# Patient Record
Sex: Male | Born: 1960 | Race: Black or African American | Hispanic: No | Smoking: Current every day smoker
Health system: Southern US, Community
[De-identification: ages and names within clinical notes are randomized; demographics above are authoritative.]

## PROBLEM LIST (undated history)

## (undated) DIAGNOSIS — M1990 Unspecified osteoarthritis, unspecified site: Secondary | ICD-10-CM

## (undated) DIAGNOSIS — M779 Enthesopathy, unspecified: Secondary | ICD-10-CM

## (undated) DIAGNOSIS — K219 Gastro-esophageal reflux disease without esophagitis: Secondary | ICD-10-CM

## (undated) DIAGNOSIS — J45909 Unspecified asthma, uncomplicated: Secondary | ICD-10-CM

## (undated) DIAGNOSIS — F419 Anxiety disorder, unspecified: Secondary | ICD-10-CM

## (undated) DIAGNOSIS — I10 Essential (primary) hypertension: Secondary | ICD-10-CM

## (undated) DIAGNOSIS — J449 Chronic obstructive pulmonary disease, unspecified: Secondary | ICD-10-CM

## (undated) HISTORY — DX: Enthesopathy, unspecified: M77.9

## (undated) HISTORY — PX: HAND SURGERY: SHX662

## (undated) HISTORY — DX: Anxiety disorder, unspecified: F41.9

---

## 2005-12-24 ENCOUNTER — Ambulatory Visit: Payer: Self-pay | Admitting: Internal Medicine

## 2006-01-09 ENCOUNTER — Ambulatory Visit: Payer: Self-pay | Admitting: *Deleted

## 2006-01-10 ENCOUNTER — Ambulatory Visit: Payer: Self-pay | Admitting: Internal Medicine

## 2006-10-29 ENCOUNTER — Encounter (INDEPENDENT_AMBULATORY_CARE_PROVIDER_SITE_OTHER): Payer: Self-pay | Admitting: *Deleted

## 2007-11-29 ENCOUNTER — Emergency Department (HOSPITAL_COMMUNITY): Admission: EM | Admit: 2007-11-29 | Discharge: 2007-11-29 | Payer: Self-pay | Admitting: Emergency Medicine

## 2007-12-27 ENCOUNTER — Emergency Department (HOSPITAL_COMMUNITY): Admission: EM | Admit: 2007-12-27 | Discharge: 2007-12-27 | Payer: Self-pay | Admitting: Emergency Medicine

## 2008-02-15 ENCOUNTER — Emergency Department (HOSPITAL_COMMUNITY): Admission: EM | Admit: 2008-02-15 | Discharge: 2008-02-15 | Payer: Self-pay | Admitting: Emergency Medicine

## 2008-07-20 ENCOUNTER — Encounter (INDEPENDENT_AMBULATORY_CARE_PROVIDER_SITE_OTHER): Payer: Self-pay | Admitting: Emergency Medicine

## 2008-07-20 ENCOUNTER — Ambulatory Visit: Payer: Self-pay | Admitting: Vascular Surgery

## 2008-07-20 ENCOUNTER — Emergency Department (HOSPITAL_COMMUNITY): Admission: EM | Admit: 2008-07-20 | Discharge: 2008-07-20 | Payer: Self-pay | Admitting: Emergency Medicine

## 2009-02-21 ENCOUNTER — Emergency Department (HOSPITAL_COMMUNITY): Admission: EM | Admit: 2009-02-21 | Discharge: 2009-02-21 | Payer: Self-pay | Admitting: Emergency Medicine

## 2010-05-21 LAB — DIFFERENTIAL
Basophils Relative: 0 % (ref 0–1)
Lymphs Abs: 1.3 10*3/uL (ref 0.7–4.0)
Monocytes Relative: 7 % (ref 3–12)

## 2010-05-21 LAB — BASIC METABOLIC PANEL
BUN: 9 mg/dL (ref 6–23)
CO2: 27 mEq/L (ref 19–32)
Chloride: 100 mEq/L (ref 96–112)
GFR calc Af Amer: >60 mL/min (ref >60)
GFR calc non Af Amer: >60 mL/min (ref >60)
Sodium: 137 mEq/L (ref 135–145)

## 2010-05-21 LAB — CBC
HCT: 40.8 % (ref 39.0–52.0)
Hemoglobin: 14.3 g/dL (ref 13.0–17.0)
Platelets: 266 10*3/uL (ref 150–400)
RBC: 4.14 MIL/uL — ABNORMAL LOW (ref 4.22–5.81)
RDW: 14.1 % (ref 11.5–15.5)
WBC: 6.1 10*3/uL (ref 4.0–10.5)

## 2010-05-21 LAB — D-DIMER, QUANTITATIVE: D-Dimer, Quant: 1.36 ug/mL-FEU — ABNORMAL HIGH (ref 0.00–0.48)

## 2010-05-21 LAB — APTT: aPTT: 34 seconds (ref 24–37)

## 2010-11-13 LAB — COMPREHENSIVE METABOLIC PANEL
AST: 97 — ABNORMAL HIGH
Albumin: 4
CO2: 25
Chloride: 97
Creatinine, Ser: 1.16
Glucose, Bld: 93
Potassium: 3.6
Total Bilirubin: 1

## 2010-11-13 LAB — URINALYSIS, ROUTINE W REFLEX MICROSCOPIC
Hgb urine dipstick: NEGATIVE
Ketones, ur: 40 — AB
Nitrite: NEGATIVE
Protein, ur: 30 — AB
Specific Gravity, Urine: >1.03 — ABNORMAL HIGH

## 2010-11-13 LAB — DIFFERENTIAL
Basophils Absolute: 0
Eosinophils Absolute: 0
Eosinophils Relative: 0

## 2010-11-13 LAB — CBC
MCHC: 35
Platelets: 248
RBC: 4.68
RDW: 14.9
WBC: 8.4

## 2010-11-13 LAB — ETHANOL: Alcohol, Ethyl (B): 10

## 2010-11-13 LAB — URINE MICROSCOPIC-ADD ON

## 2014-11-16 ENCOUNTER — Emergency Department (HOSPITAL_COMMUNITY): Payer: Self-pay

## 2014-11-16 ENCOUNTER — Encounter (HOSPITAL_COMMUNITY): Payer: Self-pay

## 2014-11-16 ENCOUNTER — Inpatient Hospital Stay (HOSPITAL_COMMUNITY)
Admission: EM | Admit: 2014-11-16 | Discharge: 2014-11-20 | DRG: 156 | Disposition: A | Discharge status: Home / Self Care | Payer: Self-pay | Attending: Surgery | Admitting: Surgery

## 2014-11-16 DIAGNOSIS — Z978 Presence of other specified devices: Secondary | ICD-10-CM

## 2014-11-16 DIAGNOSIS — J384 Edema of larynx: Secondary | ICD-10-CM | POA: Diagnosis present

## 2014-11-16 DIAGNOSIS — J988 Other specified respiratory disorders: Secondary | ICD-10-CM

## 2014-11-16 DIAGNOSIS — R402414 Glasgow coma scale score 13-15, 24 hours or more after hospital admission: Secondary | ICD-10-CM | POA: Diagnosis present

## 2014-11-16 DIAGNOSIS — Z01818 Encounter for other preprocedural examination: Secondary | ICD-10-CM

## 2014-11-16 DIAGNOSIS — F17210 Nicotine dependence, cigarettes, uncomplicated: Secondary | ICD-10-CM | POA: Diagnosis present

## 2014-11-16 DIAGNOSIS — J9690 Respiratory failure, unspecified, unspecified whether with hypoxia or hypercapnia: Secondary | ICD-10-CM

## 2014-11-16 DIAGNOSIS — S128XXA Fracture of other parts of neck, initial encounter: Principal | ICD-10-CM

## 2014-11-16 DIAGNOSIS — I10 Essential (primary) hypertension: Secondary | ICD-10-CM | POA: Diagnosis present

## 2014-11-16 DIAGNOSIS — M542 Cervicalgia: Secondary | ICD-10-CM

## 2014-11-16 HISTORY — DX: Essential (primary) hypertension: I10

## 2014-11-16 LAB — BASIC METABOLIC PANEL
Anion gap: 7 (ref 5–15)
BUN: 6 mg/dL (ref 6–20)
CHLORIDE: 107 mmol/L (ref 101–111)
CO2: 24 mmol/L (ref 22–32)
CREATININE: 0.84 mg/dL (ref 0.61–1.24)
Calcium: 7.9 mg/dL — ABNORMAL LOW (ref 8.9–10.3)
GFR calc non Af Amer: >60 mL/min (ref >60)
Glucose, Bld: 113 mg/dL — ABNORMAL HIGH (ref 65–99)
POTASSIUM: 4.1 mmol/L (ref 3.5–5.1)
SODIUM: 138 mmol/L (ref 135–145)

## 2014-11-16 LAB — PROTIME-INR
INR: 1.05 (ref 0.00–1.49)
PROTHROMBIN TIME: 13.9 s (ref 11.6–15.2)

## 2014-11-16 LAB — CBC WITH DIFFERENTIAL/PLATELET
BASOS ABS: 0 10*3/uL (ref 0.0–0.1)
BASOS PCT: 0 %
EOS ABS: 0 10*3/uL (ref 0.0–0.7)
Eosinophils Relative: 0 %
HCT: 37.5 % — ABNORMAL LOW (ref 39.0–52.0)
HEMOGLOBIN: 12.6 g/dL — AB (ref 13.0–17.0)
Lymphocytes Relative: 7 %
Lymphs Abs: 0.6 10*3/uL — ABNORMAL LOW (ref 0.7–4.0)
MCH: 32.5 pg (ref 26.0–34.0)
MCHC: 33.6 g/dL (ref 30.0–36.0)
MCV: 96.6 fL (ref 78.0–100.0)
Monocytes Absolute: 0.2 10*3/uL (ref 0.1–1.0)
Monocytes Relative: 2 %
NEUTROS PCT: 91 %
Neutro Abs: 7.9 10*3/uL — ABNORMAL HIGH (ref 1.7–7.7)
Platelets: 312 10*3/uL (ref 150–400)
RBC: 3.88 MIL/uL — AB (ref 4.22–5.81)
RDW: 13.7 % (ref 11.5–15.5)
WBC: 8.7 10*3/uL (ref 4.0–10.5)

## 2014-11-16 LAB — BLOOD GAS, ARTERIAL
Acid-base deficit: 5.5 mmol/L — ABNORMAL HIGH (ref 0.0–2.0)
Bicarbonate: 20.3 mEq/L (ref 20.0–24.0)
DRAWN BY: 308601
FIO2: 1
MECHVT: 530 mL
O2 SAT: 99.7 %
PATIENT TEMPERATURE: 98.6
PCO2 ART: 43.1 mmHg (ref 35.0–45.0)
PEEP: 5 cmH2O
PH ART: 7.294 — AB (ref 7.350–7.450)
RATE: 14 resp/min
TCO2: 18.9 mmol/L (ref 0–100)
pO2, Arterial: 395 mmHg — ABNORMAL HIGH (ref 80.0–100.0)

## 2014-11-16 LAB — TRIGLYCERIDES: Triglycerides: 143 mg/dL (ref <150)

## 2014-11-16 MED ORDER — FENTANYL CITRATE (PF) 100 MCG/2ML IJ SOLN
50.0000 ug | Freq: Once | INTRAMUSCULAR | Status: AC
  Administered 2014-11-16: 50 ug via INTRAVENOUS
  Filled 2014-11-16: qty 2

## 2014-11-16 MED ORDER — PANTOPRAZOLE SODIUM 40 MG IV SOLR
40.0000 mg | Freq: Every day | INTRAVENOUS | Status: DC
  Administered 2014-11-17 – 2014-11-18 (×2): 40 mg via INTRAVENOUS
  Filled 2014-11-16 (×2): qty 40

## 2014-11-16 MED ORDER — FENTANYL CITRATE (PF) 100 MCG/2ML IJ SOLN
100.0000 ug | INTRAMUSCULAR | Status: DC | PRN
Start: 2014-11-16 — End: 2014-11-19
  Administered 2014-11-17: 100 ug via INTRAVENOUS
  Filled 2014-11-16: qty 2

## 2014-11-16 MED ORDER — SODIUM CHLORIDE 0.9 % IV BOLUS (SEPSIS)
1000.0000 mL | Freq: Once | INTRAVENOUS | Status: AC
Start: 2014-11-16 — End: 2014-11-16
  Administered 2014-11-16: 1000 mL via INTRAVENOUS

## 2014-11-16 MED ORDER — DEXAMETHASONE SODIUM PHOSPHATE 10 MG/ML IJ SOLN: 10.0000 mg | Freq: Once | INTRAMUSCULAR | Status: DC

## 2014-11-16 MED ORDER — MIDAZOLAM HCL 2 MG/2ML IJ SOLN: 2.0000 mg | INTRAMUSCULAR | Status: DC | PRN

## 2014-11-16 MED ORDER — SODIUM CHLORIDE 0.9 % IV BOLUS (SEPSIS)
1000.0000 mL | Freq: Once | INTRAVENOUS | Status: AC
  Administered 2014-11-16: 1000 mL via INTRAVENOUS

## 2014-11-16 MED ORDER — FENTANYL CITRATE (PF) 100 MCG/2ML IJ SOLN
100.0000 ug | Freq: Once | INTRAMUSCULAR | Status: AC
  Administered 2014-11-16: 100 ug via INTRAVENOUS

## 2014-11-16 MED ORDER — ONDANSETRON HCL 4 MG/2ML IJ SOLN: 4.0000 mg | Freq: Four times a day (QID) | INTRAMUSCULAR | Status: DC | PRN

## 2014-11-16 MED ORDER — ONDANSETRON HCL 4 MG PO TABS: 4.0000 mg | ORAL_TABLET | Freq: Four times a day (QID) | ORAL | Status: DC | PRN

## 2014-11-16 MED ORDER — MORPHINE SULFATE (PF) 2 MG/ML IV SOLN: 1.0000 mg | INTRAVENOUS | Status: DC | PRN

## 2014-11-16 MED ORDER — PANTOPRAZOLE SODIUM 40 MG PO TBEC
40.0000 mg | DELAYED_RELEASE_TABLET | Freq: Every day | ORAL | Status: DC
  Administered 2014-11-19 – 2014-11-20 (×2): 40 mg via ORAL
  Filled 2014-11-16 (×2): qty 1

## 2014-11-16 MED ORDER — SUCCINYLCHOLINE CHLORIDE 20 MG/ML IJ SOLN
INTRAMUSCULAR | Status: AC
  Administered 2014-11-16: 20 mg
  Filled 2014-11-16: qty 1

## 2014-11-16 MED ORDER — PROPOFOL 1000 MG/100ML IV EMUL
INTRAVENOUS | Status: AC
  Filled 2014-11-16: qty 100

## 2014-11-16 MED ORDER — FENTANYL CITRATE (PF) 100 MCG/2ML IJ SOLN
100.0000 ug | INTRAMUSCULAR | Status: AC | PRN
  Administered 2014-11-16 (×3): 100 ug via INTRAVENOUS
  Filled 2014-11-16 (×3): qty 2

## 2014-11-16 MED ORDER — IOHEXOL 350 MG/ML SOLN
100.0000 mL | Freq: Once | INTRAVENOUS | Status: AC | PRN
  Administered 2014-11-16: 100 mL via INTRAVENOUS

## 2014-11-16 MED ORDER — PROPOFOL 1000 MG/100ML IV EMUL
0.0000 ug/min | INTRAVENOUS | Status: DC
  Administered 2014-11-16: 3333.333 ug/min via INTRAVENOUS
  Filled 2014-11-16 (×2): qty 100

## 2014-11-16 MED ORDER — LIDOCAINE-EPINEPHRINE 1 %-1:100000 IJ SOLN
INTRAMUSCULAR | Status: AC
  Filled 2014-11-16: qty 1

## 2014-11-16 MED ORDER — ETOMIDATE 2 MG/ML IV SOLN
INTRAVENOUS | Status: AC
  Administered 2014-11-16: 20 mg
  Filled 2014-11-16: qty 20

## 2014-11-16 MED ORDER — SODIUM CHLORIDE 0.9 % IV SOLN
1.5000 g | Freq: Four times a day (QID) | INTRAVENOUS | Status: DC
  Administered 2014-11-16 – 2014-11-19 (×11): 1.5 g via INTRAVENOUS
  Filled 2014-11-16 (×15): qty 1.5

## 2014-11-16 MED ORDER — RACEPINEPHRINE HCL 2.25 % IN NEBU
0.5000 mL | INHALATION_SOLUTION | RESPIRATORY_TRACT | Status: DC | PRN
Start: 2014-11-16 — End: 2014-11-20
  Administered 2014-11-16: 0.5 mL via RESPIRATORY_TRACT
  Filled 2014-11-16 (×2): qty 0.5

## 2014-11-16 MED ORDER — LIDOCAINE HCL (CARDIAC) 20 MG/ML IV SOLN
INTRAVENOUS | Status: AC
  Filled 2014-11-16: qty 5

## 2014-11-16 MED ORDER — DEXAMETHASONE SODIUM PHOSPHATE 10 MG/ML IJ SOLN
10.0000 mg | Freq: Once | INTRAMUSCULAR | Status: AC
  Administered 2014-11-16: 10 mg via INTRAVENOUS
  Filled 2014-11-16: qty 1

## 2014-11-16 MED ORDER — FENTANYL CITRATE (PF) 100 MCG/2ML IJ SOLN
INTRAMUSCULAR | Status: AC
  Filled 2014-11-16: qty 2

## 2014-11-16 MED ORDER — ROCURONIUM BROMIDE 50 MG/5ML IV SOLN
INTRAVENOUS | Status: AC
  Filled 2014-11-16: qty 2

## 2014-11-16 MED ORDER — PROPOFOL 1000 MG/100ML IV EMUL
5.0000 ug/kg/min | INTRAVENOUS | Status: DC
  Administered 2014-11-16: 50 ug/kg/min via INTRAVENOUS

## 2014-11-16 MED ORDER — KCL IN DEXTROSE-NACL 20-5-0.9 MEQ/L-%-% IV SOLN
INTRAVENOUS | Status: DC
  Administered 2014-11-16 – 2014-11-18 (×5): via INTRAVENOUS
  Filled 2014-11-16 (×8): qty 1000
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
## 2014-11-16 NOTE — ED Notes (Signed)
Bed: RESA Expected date:  Expected time:  Means of arrival:  Comments: No monitor

## 2014-11-16 NOTE — Progress Notes (Signed)
EDCM spoke to patient's fiance' at bedside. Patient currently intubated. Patient's fiance' confirms patient does not have a pcp or insurance living in [place].  [facility] provide patient with contact infromation to [facility], informed patient of services there.  EDCM also provided patient with list of pcps who accept self pay patients, list of discount pharmacies and websites needymeds.org and GoodRX.com for medication assistance, phone number to inquire about the orange card, phone number to inquire about [employer], phone number to inquire about the [place], financial resources in the community such as local churches, [employer], urban ministries, and dental assistance for uninsured patients.  Patient's fiance' thankful for resources.  No further EDCM needs at this time.

## 2014-11-16 NOTE — ED Notes (Signed)
Unable to collect labs at this MD working with patient.
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
## 2014-11-16 NOTE — ED Notes (Signed)
RRT PRESENT 
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
## 2014-11-16 NOTE — ED Notes (Signed)
Pt also admits to drinking 2 40's a day.

## 2014-11-16 NOTE — ED Notes (Signed)
Intubation- [employer] present. Dillon Bjork RN recorder. RRT Lattie Haw. EDP [place] with successful intubation.

## 2014-11-17 LAB — CBC
HCT: 36.7 % — ABNORMAL LOW (ref 39.0–52.0)
HEMOGLOBIN: 12.4 g/dL — AB (ref 13.0–17.0)
MCH: 32.9 pg (ref 26.0–34.0)
MCHC: 33.8 g/dL (ref 30.0–36.0)
MCV: 97.3 fL (ref 78.0–100.0)
Platelets: 269 10*3/uL (ref 150–400)
RBC: 3.77 MIL/uL — AB (ref 4.22–5.81)
RDW: 13.9 % (ref 11.5–15.5)
WBC: 7.9 10*3/uL (ref 4.0–10.5)

## 2014-11-17 LAB — BLOOD GAS, ARTERIAL
ACID-BASE DEFICIT: 0.3 mmol/L (ref 0.0–2.0)
Bicarbonate: 25.1 mEq/L — ABNORMAL HIGH (ref 20.0–24.0)
DRAWN BY: 301361
FIO2: 0.3
MECHVT: 500 mL
O2 SAT: 95.9 %
PCO2 ART: 50.6 mmHg — AB (ref 35.0–45.0)
PEEP/CPAP: 5 cmH2O
PH ART: 7.316 — AB (ref 7.350–7.450)
PO2 ART: 90 mmHg (ref 80.0–100.0)
Patient temperature: 98.6
RATE: 14 resp/min
TCO2: 26.6 mmol/L (ref 0–100)

## 2014-11-17 LAB — MRSA PCR SCREENING: MRSA BY PCR: POSITIVE — AB

## 2014-11-17 LAB — BASIC METABOLIC PANEL
Anion gap: 9 (ref 5–15)
BUN: <5 mg/dL — ABNORMAL LOW (ref 6–20)
CALCIUM: 8 mg/dL — AB (ref 8.9–10.3)
CHLORIDE: 109 mmol/L (ref 101–111)
CO2: 22 mmol/L (ref 22–32)
CREATININE: 0.79 mg/dL (ref 0.61–1.24)
Glucose, Bld: 130 mg/dL — ABNORMAL HIGH (ref 65–99)
Potassium: 4 mmol/L (ref 3.5–5.1)
SODIUM: 140 mmol/L (ref 135–145)

## 2014-11-17 LAB — TRIGLYCERIDES: TRIGLYCERIDES: 55 mg/dL (ref <150)

## 2014-11-17 MED ORDER — ANTISEPTIC ORAL RINSE SOLUTION (CORINZ)
7.0000 mL | Freq: Four times a day (QID) | OROMUCOSAL | Status: DC
  Administered 2014-11-17 – 2014-11-18 (×7): 7 mL via OROMUCOSAL

## 2014-11-17 MED ORDER — CHLORHEXIDINE GLUCONATE 0.12% ORAL RINSE (MEDLINE KIT)
15.0000 mL | Freq: Two times a day (BID) | OROMUCOSAL | Status: DC
  Administered 2014-11-17 – 2014-11-18 (×3): 15 mL via OROMUCOSAL

## 2014-11-17 MED ORDER — PROPOFOL 1000 MG/100ML IV EMUL
5.0000 ug/kg/min | INTRAVENOUS | Status: DC
  Administered 2014-11-17 (×2): 40 ug/kg/min via INTRAVENOUS
  Administered 2014-11-17: 60 ug/kg/min via INTRAVENOUS
  Administered 2014-11-17: 55 ug/kg/min via INTRAVENOUS
  Administered 2014-11-18 (×2): 40 ug/kg/min via INTRAVENOUS
  Filled 2014-11-17 (×5): qty 100

## 2014-11-17 MED ORDER — DEXAMETHASONE SODIUM PHOSPHATE 4 MG/ML IJ SOLN
8.0000 mg | Freq: Three times a day (TID) | INTRAMUSCULAR | Status: AC
  Administered 2014-11-17 – 2014-11-18 (×4): 8 mg via INTRAVENOUS
  Filled 2014-11-17 (×4): qty 2

## 2014-11-17 MED ORDER — CHLORHEXIDINE GLUCONATE CLOTH 2 % EX PADS
6.0000 | MEDICATED_PAD | Freq: Every day | CUTANEOUS | Status: DC
  Administered 2014-11-17 – 2014-11-19 (×3): 6 via TOPICAL

## 2014-11-17 MED ORDER — SODIUM CHLORIDE 0.9 % IV SOLN
25.0000 ug/h | INTRAVENOUS | Status: DC
  Administered 2014-11-17: 200 ug/h via INTRAVENOUS
  Administered 2014-11-17: 100 ug/h via INTRAVENOUS
  Administered 2014-11-18: 200 ug/h via INTRAVENOUS
  Filled 2014-11-17 (×3): qty 50

## 2014-11-17 MED ORDER — MUPIROCIN 2 % EX OINT
1.0000 "application " | TOPICAL_OINTMENT | Freq: Two times a day (BID) | CUTANEOUS | Status: DC
  Administered 2014-11-17 – 2014-11-20 (×8): 1 via NASAL
  Filled 2014-11-17 (×3): qty 22
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
## 2014-11-18 ENCOUNTER — Inpatient Hospital Stay (HOSPITAL_COMMUNITY): Payer: Self-pay

## 2014-11-18 LAB — BLOOD GAS, ARTERIAL
Acid-Base Excess: 1.5 mmol/L (ref 0.0–2.0)
BICARBONATE: 26.4 meq/L — AB (ref 20.0–24.0)
Drawn by: 41308
FIO2: 0.3
LHR: 14 {breaths}/min
O2 Saturation: 94.9 %
PATIENT TEMPERATURE: 98.6
PCO2 ART: 48.6 mmHg — AB (ref 35.0–45.0)
PEEP: 5 cmH2O
TCO2: 27.9 mmol/L (ref 0–100)
VT: 500 mL
pH, Arterial: 7.354 (ref 7.350–7.450)
pO2, Arterial: 80.3 mmHg (ref 80.0–100.0)

## 2014-11-18 LAB — CBC
HEMATOCRIT: 40.6 % (ref 39.0–52.0)
HEMOGLOBIN: 13.8 g/dL (ref 13.0–17.0)
MCH: 33.1 pg (ref 26.0–34.0)
MCHC: 34 g/dL (ref 30.0–36.0)
MCV: 97.4 fL (ref 78.0–100.0)
Platelets: 346 10*3/uL (ref 150–400)
RBC: 4.17 MIL/uL — AB (ref 4.22–5.81)
RDW: 14.3 % (ref 11.5–15.5)
WBC: 10 10*3/uL (ref 4.0–10.5)

## 2014-11-18 LAB — BASIC METABOLIC PANEL
Anion gap: 11 (ref 5–15)
CHLORIDE: 108 mmol/L (ref 101–111)
CO2: 26 mmol/L (ref 22–32)
Calcium: 8.7 mg/dL — ABNORMAL LOW (ref 8.9–10.3)
Creatinine, Ser: 0.7 mg/dL (ref 0.61–1.24)
GFR calc Af Amer: >60 mL/min (ref >60)
GFR calc non Af Amer: >60 mL/min (ref >60)
Glucose, Bld: 143 mg/dL — ABNORMAL HIGH (ref 65–99)
POTASSIUM: 4.5 mmol/L (ref 3.5–5.1)
SODIUM: 145 mmol/L (ref 135–145)

## 2014-11-18 MED ORDER — ENOXAPARIN SODIUM 30 MG/0.3ML ~~LOC~~ SOLN
30.0000 mg | Freq: Two times a day (BID) | SUBCUTANEOUS | Status: DC
  Administered 2014-11-18 – 2014-11-19 (×4): 30 mg via SUBCUTANEOUS
  Filled 2014-11-18 (×5): qty 0.3

## 2014-11-18 MED ORDER — MORPHINE SULFATE (PF) 2 MG/ML IV SOLN: 2.0000 mg | INTRAVENOUS | Status: DC | PRN
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
## 2014-11-18 NOTE — Procedures (Signed)
Extubation Procedure Note  Patient Details:   Name: Daniel [name] DOB: [birth year] MRN: [id number]   Airway Documentation:     Evaluation  O2 sats: stable throughout Complications: No apparent complications Patient did tolerate procedure well. Bilateral Breath Sounds: Rhonchi Suctioning: Airway Yes   Patient was extubated to a 4L [place]. Cuff leak was heard. No stridor noted. MD, RN were at bedside during extubation. Patient was able to talk upon extubation. RT will continue to monitor.  Tiburcio Bash 11/18/2014, 11:37 AM

## 2014-11-18 NOTE — Progress Notes (Signed)
Fentanyl gtt wasted into sink, 180 mL, wasted with Cleotis Nipper RN.

## 2014-11-18 NOTE — Progress Notes (Signed)
Cervical Collar D/C'ed at 15:15 11/18/14 per Silvestre Gunner, PA and ENT MD decision.

## 2014-11-19 LAB — TRIGLYCERIDES: TRIGLYCERIDES: 60 mg/dL (ref <150)

## 2014-11-19 MED ORDER — OXYCODONE HCL 5 MG PO TABS: 10.0000 mg | ORAL_TABLET | ORAL | Status: DC | PRN

## 2014-11-19 MED ORDER — ACETAMINOPHEN 325 MG PO TABS: 650.0000 mg | ORAL_TABLET | Freq: Four times a day (QID) | ORAL | Status: DC | PRN
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
## 2014-11-20 MED ORDER — OXYCODONE HCL 10 MG PO TABS: 10.0000 mg | ORAL_TABLET | ORAL | Status: DC | PRN
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
## 2014-11-20 NOTE — Discharge Instructions (Signed)
Respiratory failure is when your lungs are not working well and your breathing (respiratory) system fails. When respiratory failure occurs, it is difficult for your lungs to get enough oxygen, get rid of carbon dioxide, or both. Respiratory failure can be life threatening.  °Respiratory failure can be acute or chronic. Acute respiratory failure is sudden, severe, and requires emergency medical treatment. Chronic respiratory failure is less severe, happens over time, and requires ongoing treatment.  °WHAT ARE THE CAUSES OF ACUTE RESPIRATORY FAILURE?  °Any problem affecting the heart or lungs can cause acute respiratory failure. Some of these causes include the following: °· Chronic bronchitis and emphysema (COPD).   °· Blood clot going to a lung (pulmonary embolism).   °· Having water in the lungs caused by heart failure, lung injury, or infection (pulmonary edema).   °· Collapsed lung (pneumothorax).   °· Pneumonia.   °· Pulmonary fibrosis.   °· Obesity.   °· Asthma.   °· Heart failure.   °· Any type of trauma to the chest that can make breathing difficult.   °· Nerve or muscle diseases making chest movements difficult. °HOW WILL MY ACUTE RESPIRATORY FAILURE BE TREATED?  °Treatment of acute respiratory failure depends on the cause of the respiratory failure. Usually, you will stay in the intensive care unit so your breathing can be watched closely. Treatment can include the following: °· Oxygen. Oxygen can be delivered through the following: °¨ Nasal cannula. This is small tubing that goes in your nose to give you oxygen. °¨ Face mask. A face mask covers your nose and mouth to give you oxygen. °· Medicine. Different medicines can be given to help with breathing. These can include: °¨ Nebulizers. Nebulizers deliver medicines to open the air passages (bronchodilators). These medicines help to open or relax the airways in the lungs so you can breathe better. They can also help loosen mucus from your  lungs. °¨ Diuretics. Diuretic medicines can help you breathe better by getting rid of extra water in your body. °¨ Steroids. Steroid medicines can help decrease swelling (inflammation) in your lungs. °¨ Antibiotics. °· Chest tube. If you have a collapsed lung (pneumothorax), a chest tube is placed to help reinflate the lung. °· Noninvasive positive pressure ventilation (NPPV). This is a tight-fitting mask that goes over your nose and mouth. The mask has tubing that is attached to a machine. The machine blows air into the tubing, which helps to keep the tiny air sacs (alveoli) in your lungs open. This machine allows you to breathe on your own. °· Ventilator. A ventilator is a breathing machine. When on a ventilator, a breathing tube is put into the lungs. A ventilator is used when you can no longer breathe well enough on your own. You may have low oxygen levels or high carbon dioxide (CO2) levels in your blood. When you are on a ventilator, sedation and pain medicines are given to make you sleep so your lungs can heal. °SEEK IMMEDIATE MEDICAL CARE IF: °· You have shortness of breath (dyspnea) with or without activity. °· You have rapid breathing (tachypnea). °· You are wheezing. °· You are unable to say more than a few words without having to catch your breath. °· You find it very difficult to function normally. °· You have a fast heart rate. °· You have a bluish color to your finger or toe nail beds. °· You have confusion or drowsiness or both. °  °This information is not intended to replace advice given to you by your health care provider. Make sure you discuss   any questions you have with your health care provider.   Document Released: 02/02/2013 Document Revised: 10/19/2014 Document Reviewed: 02/02/2013 Elsevier Interactive Patient Education 2016 [place].     Call ENT DR [facility] OFFICE Monday FOR FOLLOW UP APPT IN 2 WEEKS

## 2015-02-14 ENCOUNTER — Emergency Department (HOSPITAL_BASED_OUTPATIENT_CLINIC_OR_DEPARTMENT_OTHER): Payer: Self-pay

## 2015-02-14 ENCOUNTER — Encounter (HOSPITAL_BASED_OUTPATIENT_CLINIC_OR_DEPARTMENT_OTHER): Payer: Self-pay | Admitting: *Deleted

## 2015-02-14 DIAGNOSIS — M1990 Unspecified osteoarthritis, unspecified site: Secondary | ICD-10-CM | POA: Insufficient documentation

## 2015-02-14 DIAGNOSIS — I10 Essential (primary) hypertension: Secondary | ICD-10-CM | POA: Insufficient documentation

## 2015-02-14 DIAGNOSIS — F17210 Nicotine dependence, cigarettes, uncomplicated: Secondary | ICD-10-CM | POA: Insufficient documentation

## 2015-02-14 DIAGNOSIS — K4091 Unilateral inguinal hernia, without obstruction or gangrene, recurrent: Secondary | ICD-10-CM | POA: Insufficient documentation

## 2015-02-14 DIAGNOSIS — Z79899 Other long term (current) drug therapy: Secondary | ICD-10-CM | POA: Insufficient documentation

## 2015-02-14 DIAGNOSIS — J209 Acute bronchitis, unspecified: Secondary | ICD-10-CM | POA: Insufficient documentation

## 2015-02-14 MED ORDER — IPRATROPIUM-ALBUTEROL 0.5-2.5 (3) MG/3ML IN SOLN
3.0000 mL | Freq: Once | RESPIRATORY_TRACT | Status: AC
  Administered 2015-02-14: 3 mL via RESPIRATORY_TRACT
  Filled 2015-02-14: qty 3

## 2015-02-14 MED ORDER — ALBUTEROL SULFATE (2.5 MG/3ML) 0.083% IN NEBU
2.5000 mg | INHALATION_SOLUTION | Freq: Once | RESPIRATORY_TRACT | Status: AC
  Administered 2015-02-14: 2.5 mg via RESPIRATORY_TRACT
  Filled 2015-02-14: qty 3

## 2015-02-14 NOTE — Progress Notes (Signed)
Patient states that his breathing has improved after nebulizer treatment.

## 2015-02-14 NOTE — ED Notes (Signed)
Runny nose, cough x 2 years. Recent pain in his right testicle when he coughs.

## 2015-02-15 ENCOUNTER — Emergency Department (HOSPITAL_BASED_OUTPATIENT_CLINIC_OR_DEPARTMENT_OTHER)
Admission: EM | Admit: 2015-02-15 | Discharge: 2015-02-15 | Disposition: A | Discharge status: Home / Self Care | Payer: Self-pay | Attending: Emergency Medicine | Admitting: Emergency Medicine

## 2015-02-15 DIAGNOSIS — K4091 Unilateral inguinal hernia, without obstruction or gangrene, recurrent: Secondary | ICD-10-CM

## 2015-02-15 DIAGNOSIS — J40 Bronchitis, not specified as acute or chronic: Secondary | ICD-10-CM

## 2015-02-15 HISTORY — DX: Unspecified osteoarthritis, unspecified site: M19.90

## 2015-02-15 MED ORDER — ALBUTEROL SULFATE HFA 108 (90 BASE) MCG/ACT IN AERS: 2.0000 | INHALATION_SPRAY | Freq: Four times a day (QID) | RESPIRATORY_TRACT | Status: DC | PRN

## 2015-02-15 MED ORDER — ALBUTEROL SULFATE HFA 108 (90 BASE) MCG/ACT IN AERS
2.0000 | INHALATION_SPRAY | RESPIRATORY_TRACT | Status: DC | PRN
  Administered 2015-02-15: 2 via RESPIRATORY_TRACT
  Filled 2015-02-15: qty 6.7

## 2015-02-15 MED ORDER — DOXYCYCLINE HYCLATE 100 MG PO CAPS: 100.0000 mg | ORAL_CAPSULE | Freq: Two times a day (BID) | ORAL | Status: DC

## 2015-02-15 MED ORDER — DEXAMETHASONE SODIUM PHOSPHATE 10 MG/ML IJ SOLN
10.0000 mg | Freq: Once | INTRAMUSCULAR | Status: AC
  Administered 2015-02-15: 10 mg via INTRAMUSCULAR
  Filled 2015-02-15: qty 1

## 2015-02-15 NOTE — ED Provider Notes (Signed)
CSN: [id number]     Arrival date & time 02/14/15  2057 History   By signing my name below, I, [name], attest that this documentation has been prepared under the direction and in the presence of Merryl Hacker, MD.  Electronically Signed: [name], ED Scribe. 02/15/2015. 1:36 AM.   Chief Complaint  Patient presents with  . URI  . Groin Pain   The history is provided by the patient. No language interpreter was used.    HPI Comments: Daniel [name] is a 55 y.o. male with a PMHx of HTN who presents to the Emergency Department here for a cough. Pt c/o constant, ongoing productive cough, rhinorrhea.  Patient reports 2 year history of cough. He is a chain smoker. Denies productivity of cough. Denies fevers.Pt states he feels as though his R testicle "draws up" whenever he coughs.  Denies testicular masses.   No OTC medications or home remedies attempted prior to arrival. He denies any fever, chills, nausea, or vomiting. Pt reports normal bowel movements. He is an every day heavy smoker.   Patient reports that his wife finally convinced him to come. He does not have insurance and that is why he has not been evaluated for his chronic cough.  PCP: No primary care provider on file.    Past Medical History  Diagnosis Date  . Hypertension   . Arthritis    Past Surgical History  Procedure Laterality Date  . Hand surgery Right    No family history on file. Social History  Substance Use Topics  . Smoking status: Current Every Day Smoker -- 1.50 packs/day    Types: Cigarettes  . Smokeless tobacco: None  . Alcohol Use: Yes     Comment: daily    Review of Systems  Constitutional: Positive for chills. Negative for fever.  HENT: Positive for rhinorrhea.   Respiratory: Positive for cough. Negative for chest tightness and shortness of breath.   Cardiovascular: Negative for chest pain.  Gastrointestinal: Negative for vomiting, abdominal pain, diarrhea and constipation.  Genitourinary:  Positive for testicular pain.  Psychiatric/Behavioral: Negative for confusion.      Allergies  Review of patient's allergies indicates no known allergies.  Home Medications   Prior to Admission medications   Medication Sig Start Date End Date Taking? Authorizing Provider  albuterol (PROVENTIL HFA;VENTOLIN HFA) 108 (90 Base) MCG/ACT inhaler Inhale 2 puffs into the lungs every 6 (six) hours as needed for wheezing or shortness of breath. 02/15/15   Merryl Hacker, MD  aspirin-acetaminophen-caffeine (EXCEDRIN MIGRAINE) [phone] MG tablet Take 1 tablet by mouth every 6 (six) hours as needed for headache.    Historical Provider, MD  doxycycline (VIBRAMYCIN) 100 MG capsule Take 1 capsule (100 mg total) by mouth 2 (two) times daily. 02/15/15   Merryl Hacker, MD  oxyCODONE 10 MG TABS Take 1 tablet (10 mg total) by mouth every 4 (four) hours as needed for moderate pain. 11/20/14   Erroll Luna, MD   Triage Vitals: BP 127/89 mmHg  Pulse 92  Temp(Src) 98.2 F (36.8 C) (Oral)  Resp 20  Ht 5\' 7"  (1.702 m)  Wt 143 lb (64.864 kg)  BMI 22.39 kg/m2  SpO2 98%   Physical Exam  Constitutional: He is oriented to person, place, and time. He appears well-developed and well-nourished. No distress.  HENT:  Head: Normocephalic and atraumatic.  Cardiovascular: Normal rate, regular rhythm and normal heart sounds.   No murmur heard. Pulmonary/Chest: Effort normal. No respiratory distress. He  has wheezes. He has no rales.  Abdominal: Soft. Bowel sounds are normal. There is no tenderness. There is no rebound.  Genitourinary: Penis normal.   No scrotal masses palpated, reducible inguinal hernia palpated within the inguinal region , no overlying skin changes  Musculoskeletal: He exhibits no edema.  Neurological: He is alert and oriented to person, place, and time.  Skin: Skin is warm and dry.  Psychiatric: He has a normal mood and affect.  Nursing note and vitals reviewed.   ED Course  Procedures  (including critical care time)  DIAGNOSTIC STUDIES: Oxygen Saturation is 96% on RA, adequate by my interpretation.    COORDINATION OF CARE: 1:30 AM- Will order CXR. Will give breathing treatment. Discussed treatment plan with pt at bedside and pt agreed to plan.     Labs Review Labs Reviewed - No data to display  Imaging Review Dg Chest 2 View  02/14/2015  CLINICAL DATA:  Cough and SOB x 2 years with sudden onset of pulling sensation in groin area. HX: HTN,smoker EXAM: CHEST  2 VIEW COMPARISON:  11/18/2014 FINDINGS: The lungs are hyperinflated. Heart size is normal. Lungs are clear. No pulmonary edema. IMPRESSION: No evidence for acute cardiopulmonary abnormality.  Hyperinflation. Electronically Signed   By: Nolon Nations M.D.   On: 02/14/2015 21:42   I have personally reviewed and evaluated these images and lab results as part of my medical decision-making.   EKG Interpretation None      MDM   Final diagnoses:  Bronchitis  Unilateral recurrent inguinal hernia without obstruction or gangrene     patient presents with multiple complaints including rhinorrhea, cough and right testicular pain with coughing. Symptoms have been ongoing and progressive. He is a smoker. Suspect underlying COPD. He is wheezing on exam. He is otherwise nontoxic and satting 98% on room air. Patient was given an inhaler and a dose of Decadron. Chest x-ray is negative for pneumonia.  He does have a small palpable inguinal hernia that is reducible on exam. No evidence of strength relation or incarceration. Reports normal bowel movements without vomiting. Suspect that this is causing his pain with coughing and increased  Intra-abdominal pressure. Discussed this with the patient.   Will treat patient for chronic bronchitis/COPD. He  Will be given  An inhaler and doxycycline at discharge. He was given a referral for [facility]. He was also given surgical referral for his inguinal hernia.  After history, exam, and  medical workup I feel the patient has been appropriately medically screened and is safe for discharge home. Pertinent diagnoses were discussed with the patient. Patient was given return precautions.  I personally performed the services described in this documentation, which was scribed in my presence. The recorded information has been reviewed and is accurate.   Merryl Hacker, MD 02/15/15 [phone]

## 2015-02-15 NOTE — Discharge Instructions (Signed)
Inguinal Hernia, Adult Muscles help keep everything in the body in its proper place. But if a weak spot in the muscles develops, something can poke through. That is called a hernia. When this happens in the lower part of the belly (abdomen), it is called an inguinal hernia. (It takes its name from a part of the body in this region called the inguinal canal.) A weak spot in the wall of muscles lets some fat or part of the small intestine bulge through. An inguinal hernia can develop at any age. Men get them more often than women. CAUSES  In adults, an inguinal hernia develops over time.  It can be triggered by:  Suddenly straining the muscles of the lower abdomen.  Lifting heavy objects.  Straining to have a bowel movement. Difficult bowel movements (constipation) can lead to this.  Constant coughing. This may be caused by smoking or lung disease.  Being overweight.  Being pregnant.  Working at a job that requires long periods of standing or heavy lifting.  Having had an inguinal hernia before. One type can be an emergency situation. It is called a strangulated inguinal hernia. It develops if part of the small intestine slips through the weak spot and cannot get back into the abdomen. The blood supply can be cut off. If that happens, part of the intestine may die. This situation requires emergency surgery. SYMPTOMS  Often, a small inguinal hernia has no symptoms. It is found when a healthcare provider does a physical exam. Larger hernias usually have symptoms.   In adults, symptoms may include:  A lump in the groin. This is easier to see when the person is standing. It might disappear when lying down.  In men, a lump in the scrotum.  Pain or burning in the groin. This occurs especially when lifting, straining or coughing.  A dull ache or feeling of pressure in the groin.  Signs of a strangulated hernia can include:  A bulge in the groin that becomes very painful and tender to the  touch.  A bulge that turns red or purple.  Fever, nausea and vomiting.  Inability to have a bowel movement or to pass gas. DIAGNOSIS  To decide if you have an inguinal hernia, a healthcare provider will probably do a physical examination.  This will include asking questions about any symptoms you have noticed.  The healthcare provider might feel the groin area and ask you to cough. If an inguinal hernia is felt, the healthcare provider may try to slide it back into the abdomen.  Usually no other tests are needed. TREATMENT  Treatments can vary. The size of the hernia makes a difference. Options include:  Watchful waiting. This is often suggested if the hernia is small and you have had no symptoms.  No medical procedure will be done unless symptoms develop.  You will need to watch closely for symptoms. If any occur, contact your healthcare provider right away.  Surgery. This is used if the hernia is larger or you have symptoms.  Open surgery. This is usually an outpatient procedure (you will not stay overnight in a hospital). An cut (incision) is made through the skin in the groin. The hernia is put back inside the abdomen. The weak area in the muscles is then repaired by herniorrhaphy or hernioplasty. Herniorrhaphy: in this type of surgery, the weak muscles are sewn back together. Hernioplasty: a patch or mesh is used to close the weak area in the abdominal wall.  Laparoscopy.  In this procedure, a surgeon makes small incisions. A thin tube with a tiny video camera (called a laparoscope) is put into the abdomen. The surgeon repairs the hernia with mesh by looking with the video camera and using two long instruments. HOME CARE INSTRUCTIONS   After surgery to repair an inguinal hernia:  You will need to take pain medicine prescribed by your healthcare provider. Follow all directions carefully.  You will need to take care of the wound from the incision.  Your activity will be  restricted for awhile. This will probably include no heavy lifting for several weeks. You also should not do anything too active for a few weeks. When you can return to work will depend on the type of job that you have.  During "watchful waiting" periods, you should:  Maintain a healthy weight.  Eat a diet high in fiber (fruits, vegetables and whole grains).  Drink plenty of fluids to avoid constipation. This means drinking enough water and other liquids to keep your urine clear or pale yellow.  Do not lift heavy objects.  Do not stand for long periods of time.  Quit smoking. This should keep you from developing a frequent cough. SEEK MEDICAL CARE IF:   A bulge develops in your groin area.  You feel pain, a burning sensation or pressure in the groin. This might be worse if you are lifting or straining.  You develop a fever of more than 100.5 F (38.1 C). SEEK IMMEDIATE MEDICAL CARE IF:   Pain in the groin increases suddenly.  A bulge in the groin gets bigger suddenly and does not go down.  For men, there is sudden pain in the scrotum. Or, the size of the scrotum increases.  A bulge in the groin area becomes red or purple and is painful to touch.  You have nausea or vomiting that does not go away.  You feel your heart beating much faster than normal.  You cannot have a bowel movement or pass gas.  You develop a fever of more than 102.0 F (38.9 C).   This information is not intended to replace advice given to you by your health care provider. Make sure you discuss any questions you have with your health care provider.   Document Released: 06/16/2008 Document Revised: 04/22/2011 Document Reviewed: 08/01/2014 Elsevier Interactive Patient Education 2016 Else[employer] Chronic Bronchitis Chronic bronchitis is a lasting inflammation of the bronchial tubes, which are the tubes that carry air into your lungs. This is inflammation that occurs:   On most days of the week.    For at least three months at a time.   Over a period of two years in a row. When the bronchial tubes are inflamed, they start to produce mucus. The inflammation and buildup of mucus make it more difficult to breathe. Chronic bronchitis is usually a permanent problem and is one type of chronic obstructive pulmonary disease (COPD). People with chronic bronchitis are at greater risk for getting repeated colds, or respiratory infections. CAUSES  Chronic bronchitis most often occurs in people who have:  Long-standing, severe asthma.  A history of smoking.  Asthma and who also smoke. SIGNS AND SYMPTOMS  Chronic bronchitis may cause the following:   A cough that brings up mucus (productive cough).  Shortness of breath.  Early morning headache.  Wheezing.  Chest discomfort.   Recurring respiratory infections. DIAGNOSIS  Your health care provider may confirm the diagnosis by:  Taking your medical history.  Performing a  physical exam.  Taking a chest X-ray.   Performing pulmonary function tests. TREATMENT  Treatment involves controlling symptoms with medicines, oxygen therapy, or making lifestyle changes, such as exercising and eating a healthy, well-balanced diet. Medicines could include:  Inhalers to improve air flow in and out of your lungs.  Antibiotics to treat bacterial infections, such as pneumonia, sinus infections, and acute bronchitis. As a preventative measure, your health care provider may recommend routine vaccinations for influenza and pneumonia. This is to prevent infection and hospitalization since you may be more at risk for these types of infections.  HOME CARE INSTRUCTIONS  Take medicines only as directed by your health care provider.   If you smoke cigarettes, chew tobacco, or use electronic cigarettes, quit. If you need help quitting, ask your health care provider.  Avoid pollen, dust, animal dander, molds, smoke, and other things that cause  shortness of breath or wheezing attacks.  Talk to your health care provider about possible exercise routines. Regular exercise is very important to help you feel better.  If you are prescribed oxygen use at home follow these guidelines:  Never smoke while using oxygen. Oxygen does not burn or explode, but flammable materials will burn faster in the presence of oxygen.  Keep a Data processing manager close by. Let your fire department know that you have oxygen in your home.  Warn visitors not to smoke near you when you are using oxygen. Put up "no smoking" signs in your home where you most often use the oxygen.  Regularly test your smoke detectors at home to make sure they work. If you receive care in your home from a nurse or other health care provider, he or she may also check to make sure your smoke detectors work.  Ask your health care provider whether you would benefit from a pulmonary rehabilitation program.  Do not wait to get medical care if you have any concerning symptoms. Delays could cause permanent injury and may be life threatening. SEEK MEDICAL CARE IF:  You have increased coughing or shortness of breath or both.  You have muscle aches.  You have chest pain.  Your mucus gets thicker.  Your mucus changes from clear or white to yellow, green, gray, or bloody. SEEK IMMEDIATE MEDICAL CARE IF:  Your usual medicines do not stop your wheezing.   You have increased difficulty breathing.   You have any problems with the medicine you are taking, such as a rash, itching, swelling, or trouble breathing. MAKE SURE YOU:   Understand these instructions.  Will watch your condition.  Will get help right away if you are not doing well or get worse.   This information is not intended to replace advice given to you by your health care provider. Make sure you discuss any questions you have with your health care provider.   Document Released: 11/15/2005 Document Revised: 02/18/2014  Document Reviewed: 03/08/2013 Elsevier Interactive Patient Education [employer].

## 2015-02-15 NOTE — ED Notes (Signed)
Patient stable and ambulatory.  Patient verbalizes understanding of discharge medications, instructions and follow-up. 

## 2016-04-13 ENCOUNTER — Encounter (HOSPITAL_COMMUNITY): Payer: Self-pay | Admitting: Emergency Medicine

## 2016-04-13 ENCOUNTER — Emergency Department (HOSPITAL_COMMUNITY)
Admission: EM | Admit: 2016-04-13 | Discharge: 2016-04-13 | Disposition: A | Discharge status: Home / Self Care | Payer: Self-pay | Attending: Emergency Medicine | Admitting: Emergency Medicine

## 2016-04-13 DIAGNOSIS — Z79899 Other long term (current) drug therapy: Secondary | ICD-10-CM | POA: Insufficient documentation

## 2016-04-13 DIAGNOSIS — K4090 Unilateral inguinal hernia, without obstruction or gangrene, not specified as recurrent: Secondary | ICD-10-CM | POA: Insufficient documentation

## 2016-04-13 DIAGNOSIS — Z7982 Long term (current) use of aspirin: Secondary | ICD-10-CM | POA: Insufficient documentation

## 2016-04-13 DIAGNOSIS — F17210 Nicotine dependence, cigarettes, uncomplicated: Secondary | ICD-10-CM | POA: Insufficient documentation

## 2016-04-13 DIAGNOSIS — I10 Essential (primary) hypertension: Secondary | ICD-10-CM | POA: Insufficient documentation

## 2016-04-13 MED ORDER — IBUPROFEN 200 MG PO TABS
400.0000 mg | ORAL_TABLET | Freq: Once | ORAL | Status: AC
  Administered 2016-04-13: 400 mg via ORAL
  Filled 2016-04-13: qty 2

## 2016-04-13 MED ORDER — ACETAMINOPHEN 325 MG PO TABS
650.0000 mg | ORAL_TABLET | Freq: Once | ORAL | Status: AC
  Administered 2016-04-13: 650 mg via ORAL
  Filled 2016-04-13: qty 2

## 2016-04-13 NOTE — ED Provider Notes (Signed)
[facility] DEPT Provider Note   CSN: [id number] Arrival date & time: 04/13/16  1735     History   Chief Complaint Chief Complaint  Patient presents with  . Inguinal Hernia    HPI Daniel [name] is a 56 y.o. male.  Patient c/o right sided hernia for the past 1 year. States it is bothering him more and feels it is getting bigger. With standing upright, or straining he says it pooches out, and then will go back in.  No incarceration. No scrotal or testicular pain. Mild pain to right groin intermittently. Denies abd distension. No nausea or vomiting. Is having normal bms.  Denies prior eval for same.    The history is provided by the patient.    Past Medical History:  Diagnosis Date  . Arthritis   . Hypertension     Patient Active Problem List   Diagnosis Date Noted  . Fracture, thyroid cartilage closed ([place]) 11/16/2014    Past Surgical History:  Procedure Laterality Date  . HAND SURGERY Right        Home Medications    Prior to Admission medications   Medication Sig Start Date End Date Taking? Authorizing Provider  albuterol (PROVENTIL HFA;VENTOLIN HFA) 108 (90 Base) MCG/ACT inhaler Inhale 2 puffs into the lungs every 6 (six) hours as needed for wheezing or shortness of breath. 02/15/15   Merryl Hacker, MD  aspirin-acetaminophen-caffeine (EXCEDRIN MIGRAINE) [phone] MG tablet Take 1 tablet by mouth every 6 (six) hours as needed for headache.    Historical Provider, MD  doxycycline (VIBRAMYCIN) 100 MG capsule Take 1 capsule (100 mg total) by mouth 2 (two) times daily. 02/15/15   Merryl Hacker, MD  oxyCODONE 10 MG TABS Take 1 tablet (10 mg total) by mouth every 4 (four) hours as needed for moderate pain. 11/20/14   Erroll Luna, MD    Family History No family history on file.  Social History Social History  Substance Use Topics  . Smoking status: Current Every Day Smoker    Packs/day: 1.50    Types: Cigarettes  . Smokeless tobacco: Not on file  .  Alcohol use Yes     Comment: daily     Allergies   Patient has no known allergies.   Review of Systems Review of Systems  Constitutional: Negative for fever.  Respiratory: Negative for shortness of breath.   Cardiovascular: Negative for chest pain.  Gastrointestinal: Negative for constipation and vomiting.  Genitourinary: Negative for dysuria and flank pain.  Musculoskeletal: Negative for back pain.     Physical Exam Updated Vital Signs BP 146/97 (BP Location: Left Arm)   Pulse 103   Temp 97.8 F (36.6 C) (Oral)   Resp 18   SpO2 97%   Physical Exam  Constitutional: He appears well-developed and well-nourished. No distress.  HENT:  Mouth/Throat: Oropharynx is clear and moist.  Eyes: Conjunctivae are normal.  Neck: Neck supple. No tracheal deviation present.  Cardiovascular: Normal rate.   Pulmonary/Chest: Effort normal. No accessory muscle usage. No respiratory distress.  Abdominal: Soft. Bowel sounds are normal. He exhibits no distension and no mass. There is no tenderness. There is no rebound and no guarding. A hernia is present.  Soft, non tender, reducible, right inguinal hernia. No scrotal or testicular pain/tenderness/swelling.   Genitourinary:  Genitourinary Comments: No cva tenderness. Normal ext genitalia. No incarc hernia.   Musculoskeletal: He exhibits no edema.  Neurological: He is alert.  Skin: Skin is warm and dry. No rash noted.  He is not diaphoretic.  Psychiatric: He has a normal mood and affect.  Nursing note and vitals reviewed.    ED Treatments / Results  Labs (all labs ordered are listed, but only abnormal results are displayed) Labs Reviewed - No data to display  EKG  EKG Interpretation None       Radiology No results found.  Procedures Procedures (including critical care time)  Medications Ordered in ED Medications - No data to display   Initial Impression / Assessment and Plan / ED Course  I have reviewed the triage vital  signs and the nursing notes.  Pertinent labs & imaging results that were available during my care of the patient were reviewed by me and considered in my medical decision making (see chart for details).  Pt with reduced hernia.   He requests something for pain. Tylenol/motrin po.  Discussed need for gen surg f/u.  Return precautions provided.    Final Clinical Impressions(s) / ED Diagnoses   Final diagnoses:  None    New Prescriptions New Prescriptions   No medications on file     Lajean Saver, MD 04/13/16 [id number]

## 2016-04-13 NOTE — ED Notes (Signed)
Discharge instructions and follow up care reviewed with patient. Patient verbalized understanding. 

## 2016-04-13 NOTE — ED Notes (Signed)
Pt ambulated to room from triage with no assistance needed.

## 2016-04-13 NOTE — ED Triage Notes (Signed)
Pt verbalizes worsening right inguinal hernia pain over 4-5 months unrelieved by motrin.

## 2016-04-13 NOTE — Discharge Instructions (Signed)
It was our pleasure to provide your ER care today - we hope that you feel better.  Follow up with general surgeon in the next 1-2 weeks - see referral - call office Monday to arrange appointment.  Return to ER if worse, hernia becomes stuck out and painful, severe abdominal pain, persistent vomiting, other concern.

## 2016-07-30 ENCOUNTER — Emergency Department (HOSPITAL_COMMUNITY)
Admission: EM | Admit: 2016-07-30 | Discharge: 2016-07-30 | Disposition: A | Discharge status: Home / Self Care | Payer: Self-pay | Attending: Emergency Medicine | Admitting: Emergency Medicine

## 2016-07-30 ENCOUNTER — Emergency Department (HOSPITAL_COMMUNITY): Payer: Self-pay

## 2016-07-30 DIAGNOSIS — I10 Essential (primary) hypertension: Secondary | ICD-10-CM | POA: Insufficient documentation

## 2016-07-30 DIAGNOSIS — R1030 Lower abdominal pain, unspecified: Secondary | ICD-10-CM | POA: Insufficient documentation

## 2016-07-30 DIAGNOSIS — F17210 Nicotine dependence, cigarettes, uncomplicated: Secondary | ICD-10-CM | POA: Insufficient documentation

## 2016-07-30 DIAGNOSIS — K4090 Unilateral inguinal hernia, without obstruction or gangrene, not specified as recurrent: Secondary | ICD-10-CM | POA: Insufficient documentation

## 2016-07-30 LAB — URINALYSIS, ROUTINE W REFLEX MICROSCOPIC
Bilirubin Urine: NEGATIVE
Glucose, UA: NEGATIVE mg/dL
Hgb urine dipstick: NEGATIVE
Ketones, ur: NEGATIVE mg/dL
Leukocytes, UA: NEGATIVE
Nitrite: NEGATIVE
Protein, ur: NEGATIVE mg/dL
Specific Gravity, Urine: 1.017 (ref 1.005–1.030)
pH: 5 (ref 5.0–8.0)

## 2016-07-30 MED ORDER — ACETAMINOPHEN 325 MG PO TABS
650.0000 mg | ORAL_TABLET | Freq: Once | ORAL | Status: AC
  Administered 2016-07-30: 650 mg via ORAL
  Filled 2016-07-30: qty 2

## 2016-07-30 NOTE — ED Provider Notes (Signed)
[facility] DEPT Provider Note   CSN: [id number] Arrival date & time: 07/30/16  0935     History   Chief Complaint No chief complaint on file.   HPI Daniel [name] is a 56 y.o. male.  56 year old male with history of inguinal hernia presents with 24-hour history of right groin swelling as well as suprapubic pressure. Denies any dysuria or hematuria. No nausea vomiting. No penile drainage or discharge. Pain is positional and worse with standing. He has not attempted to self reduce his hernia. Has not followed up with surgery since being seen for this several months ago in the ED      Past Medical History:  Diagnosis Date  . Arthritis   . Hypertension     Patient Active Problem List   Diagnosis Date Noted  . Fracture, thyroid cartilage closed ([place]) 11/16/2014    Past Surgical History:  Procedure Laterality Date  . HAND SURGERY Right        Home Medications    Prior to Admission medications   Medication Sig Start Date End Date Taking? Authorizing Provider  albuterol (PROVENTIL HFA;VENTOLIN HFA) 108 (90 Base) MCG/ACT inhaler Inhale 2 puffs into the lungs every 6 (six) hours as needed for wheezing or shortness of breath. 02/15/15   Horton, Barbette Hair, MD  aspirin-acetaminophen-caffeine (EXCEDRIN MIGRAINE) [phone] MG tablet Take 1 tablet by mouth every 6 (six) hours as needed for headache.    [provider]  doxycycline (VIBRAMYCIN) 100 MG capsule Take 1 capsule (100 mg total) by mouth 2 (two) times daily. 02/15/15   Horton, Barbette Hair, MD  oxyCODONE 10 MG TABS Take 1 tablet (10 mg total) by mouth every 4 (four) hours as needed for moderate pain. 11/20/14   Erroll Luna, MD    Family History No family history on file.  Social History Social History  Substance Use Topics  . Smoking status: Current Every Day Smoker    Packs/day: 1.50    Types: Cigarettes  . Smokeless tobacco: Not on file  . Alcohol use Yes     Comment: daily     Allergies     Patient has no known allergies.   Review of Systems Review of Systems  All other systems reviewed and are negative.    Physical Exam Updated Vital Signs BP (!) 127/96 (BP Location: Right Arm)   Pulse 94   Temp 98.2 F (36.8 C) (Oral)   Resp 20   SpO2 92%   Physical Exam  Constitutional: He is oriented to person, place, and time. He appears well-developed and well-nourished.  Non-toxic appearance. No distress.  HENT:  Head: Normocephalic and atraumatic.  Eyes: Conjunctivae, EOM and lids are normal. Pupils are equal, round, and reactive to light.  Neck: Normal range of motion. Neck supple. No tracheal deviation present. No thyroid mass present.  Cardiovascular: Normal rate, regular rhythm and normal heart sounds.  Exam reveals no gallop.   No murmur heard. Pulmonary/Chest: Effort normal and breath sounds normal. No stridor. No respiratory distress. He has no decreased breath sounds. He has no wheezes. He has no rhonchi. He has no rales.  Abdominal: Soft. Normal appearance and bowel sounds are normal. He exhibits no distension. There is no tenderness. There is no rebound and no CVA tenderness.    Genitourinary:     Musculoskeletal: Normal range of motion. He exhibits no edema or tenderness.  Neurological: He is alert and oriented to person, place, and time. He has normal strength. No cranial nerve deficit  or sensory deficit. GCS eye subscore is 4. GCS verbal subscore is 5. GCS motor subscore is 6.  Skin: Skin is warm and dry. No abrasion and no rash noted.  Psychiatric: He has a normal mood and affect. His speech is normal and behavior is normal.  Nursing note and vitals reviewed.    ED Treatments / Results  Labs (all labs ordered are listed, but only abnormal results are displayed) Labs Reviewed  URINE CULTURE  URINALYSIS, ROUTINE W REFLEX MICROSCOPIC    EKG  EKG Interpretation None       Radiology No results found.  Procedures Procedures (including  critical care time)  Medications Ordered in ED Medications  acetaminophen (TYLENOL) tablet 650 mg (not administered)     Initial Impression / Assessment and Plan / ED Course  I have reviewed the triage vital signs and the nursing notes.  Pertinent labs & imaging results that were available during my care of the patient were reviewed by me and considered in my medical decision making (see chart for details).     Patient's hernia manually reduced tear. Abdominal series negative for obstruction. Urinalysis negative. He'll be given referral to general surgery  Final Clinical Impressions(s) / ED Diagnoses   Final diagnoses:  None    New Prescriptions New Prescriptions   No medications on file     Lacretia Leigh, MD 07/30/16 1415

## 2016-07-30 NOTE — ED Notes (Signed)
Pt was given a urinal and reminded about the need for urine specimen.

## 2016-07-30 NOTE — ED Triage Notes (Signed)
Pt c/o 10/10 pain in abdomen that started last night. Pt denies n/v/d. Pt states "I think the pain is related to this hernia I have had for a while".

## 2016-08-01 LAB — URINE CULTURE

## 2016-08-02 ENCOUNTER — Telehealth: Payer: Self-pay | Admitting: *Deleted

## 2016-08-02 NOTE — Telephone Encounter (Signed)
Post ED Visit - Positive Culture Follow-up  Culture report reviewed by antimicrobial stewardship pharmacist:  []  Elenor Quinones, Pharm.D. []  Heide Guile, Pharm.D., BCPS AQ-ID []  Parks Neptune, Pharm.D., BCPS []  Alycia Rossetti, Pharm.D., BCPS []  [place], Pharm.D., BCPS, AAHIVP []  Legrand Como, Pharm.D., BCPS, AAHIVP []  Salome Arnt, PharmD, BCPS []  Dimitri Ped, PharmD, BCPS []  Vincenza Hews, PharmD, BCPS  Positive urine culture, asymptomatic and chart reviewed by Harlene Ramus, PA-C No further patient follow-up is required at this time.  Harlon Flor [facility] 08/02/2016, 11:10 AM

## 2016-08-17 ENCOUNTER — Emergency Department (HOSPITAL_COMMUNITY): Payer: Self-pay

## 2016-08-17 ENCOUNTER — Encounter (HOSPITAL_COMMUNITY): Payer: Self-pay

## 2016-08-17 ENCOUNTER — Emergency Department (HOSPITAL_COMMUNITY)
Admission: EM | Admit: 2016-08-17 | Discharge: 2016-08-17 | Disposition: A | Discharge status: Home / Self Care | Payer: Self-pay | Attending: Emergency Medicine | Admitting: Emergency Medicine

## 2016-08-17 DIAGNOSIS — I10 Essential (primary) hypertension: Secondary | ICD-10-CM | POA: Insufficient documentation

## 2016-08-17 DIAGNOSIS — M542 Cervicalgia: Secondary | ICD-10-CM | POA: Insufficient documentation

## 2016-08-17 DIAGNOSIS — Y92481 Parking lot as the place of occurrence of the external cause: Secondary | ICD-10-CM | POA: Insufficient documentation

## 2016-08-17 DIAGNOSIS — F17210 Nicotine dependence, cigarettes, uncomplicated: Secondary | ICD-10-CM | POA: Insufficient documentation

## 2016-08-17 MED ORDER — IBUPROFEN 600 MG PO TABS: 600.0000 mg | ORAL_TABLET | Freq: Four times a day (QID) | ORAL | 0 refills | Status: DC | PRN
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
## 2016-08-17 NOTE — Discharge Instructions (Addendum)
Please read attached information. If you experience any new or worsening signs or symptoms please return to the emergency room for evaluation. Please follow-up with your primary care provider or specialist as discussed. Please use medication prescribed only as directed and discontinue taking if you have any concerning signs or symptoms.   °
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
## 2016-09-23 ENCOUNTER — Encounter (HOSPITAL_COMMUNITY): Payer: Self-pay

## 2016-09-23 ENCOUNTER — Emergency Department (HOSPITAL_COMMUNITY)
Admission: EM | Admit: 2016-09-23 | Discharge: 2016-09-23 | Disposition: A | Discharge status: Home / Self Care | Payer: Self-pay | Attending: Emergency Medicine | Admitting: Emergency Medicine

## 2016-09-23 DIAGNOSIS — I10 Essential (primary) hypertension: Secondary | ICD-10-CM | POA: Insufficient documentation

## 2016-09-23 DIAGNOSIS — J441 Chronic obstructive pulmonary disease with (acute) exacerbation: Secondary | ICD-10-CM | POA: Insufficient documentation

## 2016-09-23 DIAGNOSIS — F17210 Nicotine dependence, cigarettes, uncomplicated: Secondary | ICD-10-CM | POA: Insufficient documentation

## 2016-09-23 DIAGNOSIS — K4090 Unilateral inguinal hernia, without obstruction or gangrene, not specified as recurrent: Secondary | ICD-10-CM | POA: Insufficient documentation

## 2016-09-23 DIAGNOSIS — Z79899 Other long term (current) drug therapy: Secondary | ICD-10-CM | POA: Insufficient documentation

## 2016-09-23 HISTORY — DX: Chronic obstructive pulmonary disease, unspecified: J44.9

## 2016-09-23 LAB — URINALYSIS, ROUTINE W REFLEX MICROSCOPIC
BILIRUBIN URINE: NEGATIVE
Glucose, UA: NEGATIVE mg/dL
Hgb urine dipstick: NEGATIVE
Ketones, ur: 5 mg/dL — AB
Nitrite: NEGATIVE
Protein, ur: NEGATIVE mg/dL
SPECIFIC GRAVITY, URINE: 1.009 (ref 1.005–1.030)
pH: 5 (ref 5.0–8.0)

## 2016-09-23 LAB — CBC
HEMATOCRIT: 42.2 % (ref 39.0–52.0)
Hemoglobin: 14.8 g/dL (ref 13.0–17.0)
MCH: 33 pg (ref 26.0–34.0)
MCHC: 35.1 g/dL (ref 30.0–36.0)
MCV: 94 fL (ref 78.0–100.0)
Platelets: 292 10*3/uL (ref 150–400)
RBC: 4.49 MIL/uL (ref 4.22–5.81)
RDW: 14.9 % (ref 11.5–15.5)
WBC: 4.4 10*3/uL (ref 4.0–10.5)

## 2016-09-23 LAB — BASIC METABOLIC PANEL
Anion gap: 15 (ref 5–15)
BUN: 9 mg/dL (ref 6–20)
CHLORIDE: 100 mmol/L — AB (ref 101–111)
CO2: 23 mmol/L (ref 22–32)
Calcium: 8.7 mg/dL — ABNORMAL LOW (ref 8.9–10.3)
Creatinine, Ser: 0.89 mg/dL (ref 0.61–1.24)
GFR calc Af Amer: >60 mL/min (ref >60)
GFR calc non Af Amer: >60 mL/min (ref >60)
GLUCOSE: 81 mg/dL (ref 65–99)
POTASSIUM: 4.3 mmol/L (ref 3.5–5.1)
Sodium: 138 mmol/L (ref 135–145)

## 2016-09-23 MED ORDER — ALBUTEROL SULFATE HFA 108 (90 BASE) MCG/ACT IN AERS
2.0000 | INHALATION_SPRAY | Freq: Once | RESPIRATORY_TRACT | Status: AC
  Administered 2016-09-23: 2 via RESPIRATORY_TRACT
  Filled 2016-09-23: qty 6.7

## 2016-09-23 MED ORDER — IPRATROPIUM-ALBUTEROL 0.5-2.5 (3) MG/3ML IN SOLN
3.0000 mL | Freq: Once | RESPIRATORY_TRACT | Status: AC
  Administered 2016-09-23: 3 mL via RESPIRATORY_TRACT
  Filled 2016-09-23: qty 3

## 2016-09-23 MED ORDER — BUDESONIDE 0.25 MG/2ML IN SUSP
0.2500 mg | Freq: Every day | RESPIRATORY_TRACT | Status: DC
  Administered 2016-09-23: 0.25 mg via RESPIRATORY_TRACT
  Filled 2016-09-23: qty 2

## 2016-09-23 NOTE — ED Triage Notes (Signed)
Pt presents with c/o shortness of breath. Pt reports that he has COPD and he has been feeling increasingly short of breath for the past 2 weeks but has not come in as he did not want to miss a day of work. Pt is 94% on RA, ambulatory to triage, NAD.

## 2016-09-23 NOTE — ED Provider Notes (Signed)
[facility] DEPT Provider Note   CSN: [id number] Arrival date & time: 09/23/16  0859     History   Chief Complaint Chief Complaint  Patient presents with  . Shortness of Breath    HPI Daniel [name] is a 56 y.o. male.  The history is provided by the patient.  Shortness of Breath  This is a recurrent problem. Duration: several months. The problem occurs intermittently.The problem has not changed since onset.Associated symptoms include cough and wheezing. Pertinent negatives include no fever, no rhinorrhea, no hemoptysis and no chest pain. Risk factors include smoking. He has tried nothing for the symptoms. Associated medical issues include COPD.   Also reports right inguinal hernia that is reducible and 1 day of hematuria. No penile d/c, pelvic pain, testicular pain, dysuria.  Past Medical History:  Diagnosis Date  . Arthritis   . COPD (chronic obstructive pulmonary disease) ([place])   . Hypertension     Patient Active Problem List   Diagnosis Date Noted  . Fracture, thyroid cartilage closed ([place]) 11/16/2014    Past Surgical History:  Procedure Laterality Date  . HAND SURGERY Right        Home Medications    Prior to Admission medications   Medication Sig Start Date End Date Taking? Authorizing Provider  gabapentin (NEURONTIN) 300 MG capsule Take 600 mg by mouth 2 (two) times daily.   Yes [provider]  ibuprofen (ADVIL,MOTRIN) 200 MG tablet Take 800 mg by mouth every 8 (eight) hours as needed for mild pain or moderate pain.   Yes [provider]  ipratropium (ATROVENT HFA) 17 MCG/ACT inhaler Inhale 2 puffs into the lungs every 6 (six) hours as needed for wheezing.   Yes [provider]  albuterol (PROVENTIL HFA;VENTOLIN HFA) 108 (90 Base) MCG/ACT inhaler Inhale 2 puffs into the lungs every 6 (six) hours as needed for wheezing or shortness of breath. Patient not taking: Reported on 07/30/2016 02/15/15   Horton, Barbette Hair, MD  doxycycline  (VIBRAMYCIN) 100 MG capsule Take 1 capsule (100 mg total) by mouth 2 (two) times daily. Patient not taking: Reported on 07/30/2016 02/15/15   Horton, Barbette Hair, MD  ibuprofen (ADVIL,MOTRIN) 600 MG tablet Take 1 tablet (600 mg total) by mouth every 6 (six) hours as needed. Patient not taking: Reported on 09/23/2016 08/17/16   Okey Regal, PA-C  oxyCODONE 10 MG TABS Take 1 tablet (10 mg total) by mouth every 4 (four) hours as needed for moderate pain. Patient not taking: Reported on 07/30/2016 11/20/14   Erroll Luna, MD    Family History History reviewed. No pertinent family history.  Social History Social History  Substance Use Topics  . Smoking status: Current Every Day Smoker    Packs/day: 1.50    Types: Cigarettes  . Smokeless tobacco: Never Used  . Alcohol use Yes     Comment: daily     Allergies   Patient has no known allergies.   Review of Systems Review of Systems  Constitutional: Negative for fever.  HENT: Negative for rhinorrhea.   Respiratory: Positive for cough, shortness of breath and wheezing. Negative for hemoptysis.   Cardiovascular: Negative for chest pain.   All other systems are reviewed and are negative for acute change except as noted in the HPI   Physical Exam Updated Vital Signs BP 127/81   Pulse 98   Temp 97.6 F (36.4 C) (Oral)   Resp 18   Ht 5\' 7"  (1.702 m)   Wt 70.3 kg (155  lb)   SpO2 100%   BMI 24.28 kg/m   Physical Exam  Constitutional: He is oriented to person, place, and time. He appears well-developed and well-nourished. No distress.  HENT:  Head: Normocephalic and atraumatic.  Nose: Nose normal.  Eyes: Pupils are equal, round, and reactive to light. Conjunctivae and EOM are normal. Right eye exhibits no discharge. Left eye exhibits no discharge. No scleral icterus.  Neck: Normal range of motion. Neck supple.  Cardiovascular: Normal rate and regular rhythm.  Exam reveals no gallop and no friction rub.   No murmur  heard. Pulmonary/Chest: Effort normal. No stridor. No respiratory distress. He has wheezes (diffuse). He has no rales.  Abdominal: Soft. He exhibits no distension. There is no tenderness. There is no rigidity, no rebound and no guarding. A hernia is present. Hernia confirmed positive in the right inguinal area (reducible; nontender).  Genitourinary: Penis normal. Right testis shows no swelling and no tenderness. Left testis shows no swelling and no tenderness. Uncircumcised. No penile erythema or penile tenderness. No discharge found.  Musculoskeletal: He exhibits no edema or tenderness.  Neurological: He is alert and oriented to person, place, and time.  Skin: Skin is warm and dry. No rash noted. He is not diaphoretic. No erythema.  Psychiatric: He has a normal mood and affect.  Vitals reviewed.    ED Treatments / Results  Labs (all labs ordered are listed, but only abnormal results are displayed) Labs Reviewed  BASIC METABOLIC PANEL - Abnormal; Notable for the following:       Result Value   Chloride 100 (*)    Calcium 8.7 (*)    All other components within normal limits  URINALYSIS, ROUTINE W REFLEX MICROSCOPIC - Abnormal; Notable for the following:    Ketones, ur 5 (*)    Leukocytes, UA TRACE (*)    Bacteria, UA RARE (*)    Squamous Epithelial / LPF 0-5 (*)    All other components within normal limits  CBC    EKG  EKG Interpretation  Date/Time:  Monday September 23 2016 09:08:04 EDT Ventricular Rate:  89 PR Interval:    QRS Duration: 96 QT Interval:  385 QTC Calculation: 469 R Axis:   83 Text Interpretation:  Sinus rhythm Consider right atrial enlargement Left ventricular hypertrophy Baseline wander in lead(s) II III aVF V4 Poor data quality NO STEMI No old tracing to compare Confirmed by Addison Lank [phone]) on 09/23/2016 11:30:03 AM       Radiology No results found.  Procedures Procedures (including critical care time)  Medications Ordered in ED Medications   budesonide (PULMICORT) nebulizer solution 0.25 mg (not administered)  albuterol (PROVENTIL HFA;VENTOLIN HFA) 108 (90 Base) MCG/ACT inhaler 2 puff (not administered)  ipratropium-albuterol (DUONEB) 0.5-2.5 (3) MG/3ML nebulizer solution 3 mL (3 mLs Nebulization Given 09/23/16 1208)     Initial Impression / Assessment and Plan / ED Course  I have reviewed the triage vital signs and the nursing notes.  Pertinent labs & imaging results that were available during my care of the patient were reviewed by me and considered in my medical decision making (see chart for details).     Presentation consistent with mild COPD. Significant improvement following DuoNeb's. Will provide patient with inhaled steroids and albuterol inhaler. Recommended he follow up with primary care provider for further management.  Regarding the patient's inguinal hernia, it is reducible and does not show evidence of incarceration.  UA did not reveal any hematuria. No evidence suggesting infection.  The  patient is safe for discharge with strict return precautions.   Final Clinical Impressions(s) / ED Diagnoses   Final diagnoses:  COPD exacerbation ([place])   Disposition: Discharge  Condition: Good  I have discussed the results, Dx and Tx plan with the patient who expressed understanding and agree(s) with the plan. Discharge instructions discussed at great length. The patient was given strict return precautions who verbalized understanding of the instructions. No further questions at time of discharge.    New Prescriptions   No medications on file    Follow Up: Synthia Innocent Audrea Muscat, NP [place] [address] [phone]  Schedule an appointment as soon as possible for a visit at the [facility] for follow up.      Fatima Blank, MD 09/23/16 1252

## 2016-11-02 LAB — GLUCOSE, POCT (MANUAL RESULT ENTRY): POC GLUCOSE: 102 mg/dL — AB (ref 70–99)

## 2017-02-11 HISTORY — PX: OTHER SURGICAL HISTORY: SHX169

## 2017-02-20 ENCOUNTER — Encounter (HOSPITAL_COMMUNITY): Payer: Self-pay | Admitting: Emergency Medicine

## 2017-02-20 ENCOUNTER — Emergency Department (HOSPITAL_COMMUNITY): Payer: Self-pay

## 2017-02-20 ENCOUNTER — Emergency Department (HOSPITAL_COMMUNITY)
Admission: EM | Admit: 2017-02-20 | Discharge: 2017-02-20 | Disposition: A | Discharge status: Home / Self Care | Payer: Self-pay | Attending: Emergency Medicine | Admitting: Emergency Medicine

## 2017-02-20 ENCOUNTER — Other Ambulatory Visit: Payer: Self-pay

## 2017-02-20 DIAGNOSIS — K4090 Unilateral inguinal hernia, without obstruction or gangrene, not specified as recurrent: Secondary | ICD-10-CM | POA: Insufficient documentation

## 2017-02-20 DIAGNOSIS — I10 Essential (primary) hypertension: Secondary | ICD-10-CM | POA: Insufficient documentation

## 2017-02-20 DIAGNOSIS — F17210 Nicotine dependence, cigarettes, uncomplicated: Secondary | ICD-10-CM | POA: Insufficient documentation

## 2017-02-20 DIAGNOSIS — Z79899 Other long term (current) drug therapy: Secondary | ICD-10-CM | POA: Insufficient documentation

## 2017-02-20 DIAGNOSIS — N50819 Testicular pain, unspecified: Secondary | ICD-10-CM

## 2017-02-20 DIAGNOSIS — N451 Epididymitis: Secondary | ICD-10-CM | POA: Insufficient documentation

## 2017-02-20 DIAGNOSIS — J449 Chronic obstructive pulmonary disease, unspecified: Secondary | ICD-10-CM | POA: Insufficient documentation

## 2017-02-20 LAB — URINALYSIS, ROUTINE W REFLEX MICROSCOPIC
BILIRUBIN URINE: NEGATIVE
Glucose, UA: NEGATIVE mg/dL
Hgb urine dipstick: NEGATIVE
Ketones, ur: 20 mg/dL — AB
Leukocytes, UA: NEGATIVE
Nitrite: NEGATIVE
Protein, ur: 30 mg/dL — AB
SPECIFIC GRAVITY, URINE: 1.029 (ref 1.005–1.030)
pH: 5 (ref 5.0–8.0)

## 2017-02-20 MED ORDER — HYDROCODONE-ACETAMINOPHEN 5-325 MG PO TABS: 1.0000 | ORAL_TABLET | ORAL | 0 refills | Status: DC | PRN

## 2017-02-20 MED ORDER — HYDROCODONE-ACETAMINOPHEN 5-325 MG PO TABS
1.0000 | ORAL_TABLET | Freq: Once | ORAL | Status: AC
  Administered 2017-02-20: 1 via ORAL
  Filled 2017-02-20: qty 1

## 2017-02-20 MED ORDER — CIPROFLOXACIN HCL 500 MG PO TABS: 500.0000 mg | ORAL_TABLET | Freq: Two times a day (BID) | ORAL | 0 refills | Status: DC

## 2017-02-20 NOTE — ED Triage Notes (Addendum)
Pt verbalizes worsening hernia to right groin over past few days; "it has been giving me some trouble for years, and we are trying to schedule surgery now."

## 2017-02-20 NOTE — ED Provider Notes (Signed)
[place] DEPT Provider Note   CSN: [id number] Arrival date & time: 02/20/17  1443     History   Chief Complaint Chief Complaint  Patient presents with  . Hernia    HPI Daniel [name] is a 57 y.o. male. CC:  Groin pain, h/o hernia  HPI: The patient has a long-standing history of right inguinal hernia. He is attempting to go to the [facility] card program in the [facility] for assistance. He states his become acutely more painful over the past several days and seems to be "more swollen". No difficulty urinating. Case will see blood in his urine but not today. No fevers. No chills. No flank pain.  Past Medical History:  Diagnosis Date  . Arthritis   . COPD (chronic obstructive pulmonary disease) ([place])   . Hypertension     Patient Active Problem List   Diagnosis Date Noted  . Fracture, thyroid cartilage closed ([place]) 11/16/2014    Past Surgical History:  Procedure Laterality Date  . HAND SURGERY Right        Home Medications    Prior to Admission medications   Medication Sig Start Date End Date Taking? Authorizing Provider  albuterol (PROVENTIL HFA;VENTOLIN HFA) 108 (90 Base) MCG/ACT inhaler Inhale 2 puffs into the lungs every 6 (six) hours as needed for wheezing or shortness of breath. 02/15/15  Yes Horton, Barbette Hair, MD  cloNIDine (CATAPRES) 0.1 MG tablet Take 0.1 mg by mouth at bedtime as needed.   Yes [provider]  loratadine (CLARITIN) 10 MG tablet Take 10 mg by mouth daily.   Yes [provider]  meloxicam (MOBIC) 15 MG tablet Take 15 mg by mouth daily.   Yes [provider]  montelukast (SINGULAIR) 10 MG tablet Take 10 mg by mouth daily.   Yes [provider]  ciprofloxacin (CIPRO) 500 MG tablet Take 1 tablet (500 mg total) by mouth every 12 (twelve) hours. 02/20/17   Tanna Furry, MD  doxycycline (VIBRAMYCIN) 100 MG capsule Take 1 capsule (100 mg total) by mouth 2 (two) times daily. Patient not  taking: Reported on 07/30/2016 02/15/15   Horton, Barbette Hair, MD  HYDROcodone-acetaminophen (NORCO/VICODIN) 5-325 MG tablet Take 1 tablet by mouth every 4 (four) hours as needed. 02/20/17   Tanna Furry, MD  ibuprofen (ADVIL,MOTRIN) 600 MG tablet Take 1 tablet (600 mg total) by mouth every 6 (six) hours as needed. Patient not taking: Reported on 09/23/2016 08/17/16   Okey Regal, PA-C  oxyCODONE 10 MG TABS Take 1 tablet (10 mg total) by mouth every 4 (four) hours as needed for moderate pain. Patient not taking: Reported on 07/30/2016 11/20/14   Erroll Luna, MD    Family History No family history on file.  Social History Social History   Tobacco Use  . Smoking status: Current Every Day Smoker    Packs/day: 1.50    Types: Cigarettes  . Smokeless tobacco: Never Used  Substance Use Topics  . Alcohol use: Yes    Comment: daily  . Drug use: No     Allergies   Patient has no known allergies.   Review of Systems Review of Systems  Constitutional: Negative for appetite change, chills, diaphoresis, fatigue and fever.  HENT: Negative for mouth sores, sore throat and trouble swallowing.   Eyes: Negative for visual disturbance.  Respiratory: Negative for cough, chest tightness, shortness of breath and wheezing.   Cardiovascular: Negative for chest pain.  Gastrointestinal: Negative for abdominal distention, abdominal pain, diarrhea,  nausea and vomiting.  Endocrine: Negative for polydipsia, polyphagia and polyuria.  Genitourinary: Positive for scrotal swelling. Negative for dysuria, frequency and hematuria.  Musculoskeletal: Negative for gait problem.  Skin: Negative for color change, pallor and rash.  Neurological: Negative for dizziness, syncope, light-headedness and headaches.  Hematological: Does not bruise/bleed easily.  Psychiatric/Behavioral: Negative for behavioral problems and confusion.     Physical Exam Updated Vital Signs BP (!) 174/103   Pulse 91   Temp 99.2 F (37.3  C) (Oral)   Resp 18   Ht 5\' 7"  (1.702 m)   Wt 68 kg (150 lb)   SpO2 100%   BMI 23.49 kg/m   Physical Exam  Constitutional: He is oriented to person, place, and time. He appears well-developed and well-nourished. No distress.  HENT:  Head: Normocephalic.  Eyes: Conjunctivae are normal. Pupils are equal, round, and reactive to light. No scleral icterus.  Neck: Normal range of motion. Neck supple. No thyromegaly present.  Cardiovascular: Normal rate and regular rhythm. Exam reveals no gallop and no friction rub.  No murmur heard. Pulmonary/Chest: Effort normal and breath sounds normal. No respiratory distress. He has no wheezes. He has no rales.  Abdominal: Soft. Bowel sounds are normal. He exhibits no distension. There is no tenderness. There is no rebound.  Genitourinary:  Genitourinary Comments: Large right inguinal hernia. I'm able to reduce the hernia. A large testicular prominence persists. I cannot completely separate clinically enlarged testicle versus epididymitis. Intact cremasteric reflex.  Musculoskeletal: Normal range of motion.  Neurological: He is alert and oriented to person, place, and time.  Skin: Skin is warm and dry. No rash noted.  Psychiatric: He has a normal mood and affect. His behavior is normal.     ED Treatments / Results  Labs (all labs ordered are listed, but only abnormal results are displayed) Labs Reviewed  URINALYSIS, ROUTINE W REFLEX MICROSCOPIC - Abnormal; Notable for the following components:      Result Value   Ketones, ur 20 (*)    Protein, ur 30 (*)    Bacteria, UA RARE (*)    Squamous Epithelial / LPF 0-5 (*)    All other components within normal limits    EKG  EKG Interpretation None       Radiology US Scrotum  Result Date: 02/20/2017 CLINICAL DATA:  Right testicular pain for 2 years EXAM: SCROTAL ULTRASOUND DOPPLER ULTRASOUND OF THE TESTICLES TECHNIQUE: Complete ultrasound examination of the testicles, epididymis, and other  scrotal structures was performed. Color and spectral Doppler ultrasound were also utilized to evaluate blood flow to the testicles. COMPARISON:  None. FINDINGS: Right testicle Measurements: 3.8 x 2.5 x 3.3 cm. No mass or microlithiasis visualized. Scrotal wall thickening. Left testicle Measurements: 3 x 1.7 x 2.5 cm. No mass or microlithiasis visualized. Scrotal wall thickening. Right epididymis: Enlarged epididymis with increased Doppler flow relative to the contralateral side. Left epididymis:  Normal in size and appearance. Hydrocele: Large right hydrocele with thin septations. Small left hydrocele. Varicocele:  None visualized. Pulsed Doppler interrogation of both testes demonstrates normal low resistance arterial and venous waveforms bilaterally. IMPRESSION: 1. Findings concerning for right epididymitis. Large right hydrocele with a internal septation. 2. No testicular torsion. Electronically Signed   By: Kathreen Devoid   On: 02/20/2017 13:43   [place] Scrotom Doppler  Result Date: 02/20/2017 CLINICAL DATA:  Right testicular pain for 2 years EXAM: SCROTAL ULTRASOUND DOPPLER ULTRASOUND OF THE TESTICLES TECHNIQUE: Complete ultrasound examination of the testicles, epididymis, and other scrotal  structures was performed. Color and spectral Doppler ultrasound were also utilized to evaluate blood flow to the testicles. COMPARISON:  None. FINDINGS: Right testicle Measurements: 3.8 x 2.5 x 3.3 cm. No mass or microlithiasis visualized. Scrotal wall thickening. Left testicle Measurements: 3 x 1.7 x 2.5 cm. No mass or microlithiasis visualized. Scrotal wall thickening. Right epididymis: Enlarged epididymis with increased Doppler flow relative to the contralateral side. Left epididymis:  Normal in size and appearance. Hydrocele: Large right hydrocele with thin septations. Small left hydrocele. Varicocele:  None visualized. Pulsed Doppler interrogation of both testes demonstrates normal low resistance arterial and venous  waveforms bilaterally. IMPRESSION: 1. Findings concerning for right epididymitis. Large right hydrocele with a internal septation. 2. No testicular torsion. Electronically Signed   By: Kathreen Devoid   On: 02/20/2017 13:43    Procedures Procedures (including critical care time)  Medications Ordered in ED Medications  HYDROcodone-acetaminophen (NORCO/VICODIN) 5-325 MG per tablet 1 tablet (1 tablet Oral Given 02/20/17 1250)     Initial Impression / Assessment and Plan / ED Course  I have reviewed the triage vital signs and the nursing notes.  Pertinent labs & imaging results that were available during my care of the patient were reviewed by me and considered in my medical decision making (see chart for details).    Twice Doppler ultrasound confirms epididymitis. No scrotal mass and normal blood flow. I had her case manager visit with the patient he is doing all he can to get in to see community health. We'll treat for his epididymitis.  I can for pain. Cipro. Continue follow-up as above  Final Clinical Impressions(s) / ED Diagnoses   Final diagnoses:  Right inguinal hernia  Epididymitis    ED Discharge Orders        Ordered    ciprofloxacin (CIPRO) 500 MG tablet  Every 12 hours     02/20/17 1428    HYDROcodone-acetaminophen (NORCO/VICODIN) 5-325 MG tablet  Every 4 hours PRN     02/20/17 1428       Tanna Furry, MD 02/20/17 1440

## 2017-02-20 NOTE — Discharge Instructions (Signed)
Continue to follow up with your provider and staff at [facility]. Avoid upright activity. Supportive briefs until epididymitis infection improves

## 2017-02-20 NOTE — Care Management Note (Signed)
Case Management Note  CM consulted for assistance with getting pt to a specialist for hernia surgery.  Pt is currently homeless and goes to see Marliss Coots, NP at the [facility].  Pt states that once a month they give the orange card to the first 10 people in line but he has never been able to be one of those 10.  Pt encouraged to try to get there earlier but CM additionally contacted Placey, NP to make her aware of pt's specialist need.  Advised pt follow up with Placey, NP.  Updated Dr. Jeneen Rinks.  No further CM needs noted at this time.

## 2017-03-29 ENCOUNTER — Encounter (HOSPITAL_COMMUNITY): Payer: Self-pay | Admitting: Emergency Medicine

## 2017-03-29 ENCOUNTER — Inpatient Hospital Stay (HOSPITAL_COMMUNITY)
Admission: EM | Admit: 2017-03-29 | Discharge: 2017-03-31 | DRG: 855 | Disposition: A | Discharge status: Home / Self Care | Payer: Self-pay | Attending: Family Medicine | Admitting: Family Medicine

## 2017-03-29 ENCOUNTER — Emergency Department (HOSPITAL_COMMUNITY): Payer: Self-pay

## 2017-03-29 DIAGNOSIS — K4090 Unilateral inguinal hernia, without obstruction or gangrene, not specified as recurrent: Secondary | ICD-10-CM | POA: Diagnosis present

## 2017-03-29 DIAGNOSIS — Z791 Long term (current) use of non-steroidal anti-inflammatories (NSAID): Secondary | ICD-10-CM

## 2017-03-29 DIAGNOSIS — I10 Essential (primary) hypertension: Secondary | ICD-10-CM | POA: Diagnosis present

## 2017-03-29 DIAGNOSIS — Z590 Homelessness: Secondary | ICD-10-CM

## 2017-03-29 DIAGNOSIS — F17210 Nicotine dependence, cigarettes, uncomplicated: Secondary | ICD-10-CM | POA: Diagnosis present

## 2017-03-29 DIAGNOSIS — N50811 Right testicular pain: Secondary | ICD-10-CM

## 2017-03-29 DIAGNOSIS — A419 Sepsis, unspecified organism: Principal | ICD-10-CM | POA: Diagnosis present

## 2017-03-29 DIAGNOSIS — Z79899 Other long term (current) drug therapy: Secondary | ICD-10-CM

## 2017-03-29 DIAGNOSIS — F1020 Alcohol dependence, uncomplicated: Secondary | ICD-10-CM | POA: Diagnosis present

## 2017-03-29 DIAGNOSIS — Z720 Tobacco use: Secondary | ICD-10-CM | POA: Diagnosis present

## 2017-03-29 DIAGNOSIS — N492 Inflammatory disorders of scrotum: Secondary | ICD-10-CM | POA: Diagnosis present

## 2017-03-29 DIAGNOSIS — N5089 Other specified disorders of the male genital organs: Secondary | ICD-10-CM

## 2017-03-29 DIAGNOSIS — M1990 Unspecified osteoarthritis, unspecified site: Secondary | ICD-10-CM | POA: Diagnosis present

## 2017-03-29 DIAGNOSIS — J449 Chronic obstructive pulmonary disease, unspecified: Secondary | ICD-10-CM | POA: Diagnosis present

## 2017-03-29 DIAGNOSIS — F1010 Alcohol abuse, uncomplicated: Secondary | ICD-10-CM | POA: Diagnosis present

## 2017-03-29 LAB — URINALYSIS, ROUTINE W REFLEX MICROSCOPIC
BACTERIA UA: NONE SEEN
BILIRUBIN URINE: NEGATIVE
Glucose, UA: NEGATIVE mg/dL
Ketones, ur: NEGATIVE mg/dL
Leukocytes, UA: NEGATIVE
NITRITE: NEGATIVE
PH: 6 (ref 5.0–8.0)
Protein, ur: NEGATIVE mg/dL
RBC / HPF: NONE SEEN RBC/hpf (ref 0–5)
SPECIFIC GRAVITY, URINE: 1.004 — AB (ref 1.005–1.030)

## 2017-03-29 LAB — CBC WITH DIFFERENTIAL/PLATELET
BASOS ABS: 0 10*3/uL (ref 0.0–0.1)
BASOS PCT: 0 %
EOS PCT: 1 %
Eosinophils Absolute: 0.1 10*3/uL (ref 0.0–0.7)
HCT: 36.3 % — ABNORMAL LOW (ref 39.0–52.0)
Hemoglobin: 12.4 g/dL — ABNORMAL LOW (ref 13.0–17.0)
Lymphocytes Relative: 9 %
Lymphs Abs: 1.4 10*3/uL (ref 0.7–4.0)
MCH: 32.2 pg (ref 26.0–34.0)
MCHC: 34.2 g/dL (ref 30.0–36.0)
MCV: 94.3 fL (ref 78.0–100.0)
MONO ABS: 1.4 10*3/uL — AB (ref 0.1–1.0)
MONOS PCT: 9 %
Neutro Abs: 12 10*3/uL — ABNORMAL HIGH (ref 1.7–7.7)
Neutrophils Relative %: 81 %
PLATELETS: 410 10*3/uL — AB (ref 150–400)
RBC: 3.85 MIL/uL — ABNORMAL LOW (ref 4.22–5.81)
RDW: 14.6 % (ref 11.5–15.5)
WBC: 14.9 10*3/uL — ABNORMAL HIGH (ref 4.0–10.5)

## 2017-03-29 LAB — COMPREHENSIVE METABOLIC PANEL
ALBUMIN: 3.2 g/dL — AB (ref 3.5–5.0)
ALK PHOS: 77 U/L (ref 38–126)
ALT: 13 U/L — AB (ref 17–63)
ANION GAP: 11 (ref 5–15)
AST: 20 U/L (ref 15–41)
BUN: 6 mg/dL (ref 6–20)
CALCIUM: 8.4 mg/dL — AB (ref 8.9–10.3)
CHLORIDE: 99 mmol/L — AB (ref 101–111)
CO2: 26 mmol/L (ref 22–32)
Creatinine, Ser: 0.62 mg/dL (ref 0.61–1.24)
GFR calc non Af Amer: >60 mL/min (ref >60)
GLUCOSE: 111 mg/dL — AB (ref 65–99)
Potassium: 3.1 mmol/L — ABNORMAL LOW (ref 3.5–5.1)
SODIUM: 136 mmol/L (ref 135–145)
Total Bilirubin: 0.3 mg/dL (ref 0.3–1.2)
Total Protein: 6.8 g/dL (ref 6.5–8.1)

## 2017-03-29 LAB — LACTIC ACID, PLASMA
LACTIC ACID, VENOUS: 2.2 mmol/L — AB (ref 0.5–1.9)
Lactic Acid, Venous: 1.5 mmol/L (ref 0.5–1.9)

## 2017-03-29 MED ORDER — CLONIDINE HCL 0.1 MG PO TABS
0.1000 mg | ORAL_TABLET | Freq: Every day | ORAL | Status: DC
  Administered 2017-03-29 – 2017-03-30 (×2): 0.1 mg via ORAL
  Filled 2017-03-29 (×2): qty 1

## 2017-03-29 MED ORDER — SODIUM CHLORIDE 0.9 % IV BOLUS (SEPSIS)
1000.0000 mL | Freq: Once | INTRAVENOUS | Status: AC
  Administered 2017-03-29: 1000 mL via INTRAVENOUS

## 2017-03-29 MED ORDER — LORATADINE 10 MG PO TABS
10.0000 mg | ORAL_TABLET | Freq: Every day | ORAL | Status: DC
  Administered 2017-03-30 – 2017-03-31 (×2): 10 mg via ORAL
  Filled 2017-03-29 (×2): qty 1

## 2017-03-29 MED ORDER — NICOTINE 21 MG/24HR TD PT24
21.0000 mg | MEDICATED_PATCH | Freq: Every day | TRANSDERMAL | Status: DC
  Administered 2017-03-30 – 2017-03-31 (×2): 21 mg via TRANSDERMAL
  Filled 2017-03-29 (×2): qty 1

## 2017-03-29 MED ORDER — SODIUM CHLORIDE 0.9 % IV BOLUS (SEPSIS)
250.0000 mL | Freq: Once | INTRAVENOUS | Status: AC
  Administered 2017-03-29: 250 mL via INTRAVENOUS

## 2017-03-29 MED ORDER — OXYCODONE HCL 5 MG PO TABS
5.0000 mg | ORAL_TABLET | ORAL | Status: DC | PRN
  Administered 2017-03-30 – 2017-03-31 (×5): 5 mg via ORAL
  Filled 2017-03-29 (×5): qty 1

## 2017-03-29 MED ORDER — SODIUM CHLORIDE 0.9 % IV SOLN
INTRAVENOUS | Status: DC
  Administered 2017-03-30 – 2017-03-31 (×2): via INTRAVENOUS

## 2017-03-29 MED ORDER — ONDANSETRON HCL 4 MG PO TABS: 4.0000 mg | ORAL_TABLET | Freq: Four times a day (QID) | ORAL | Status: DC | PRN

## 2017-03-29 MED ORDER — LORAZEPAM 2 MG/ML IJ SOLN
1.0000 mg | INTRAMUSCULAR | Status: DC | PRN
  Administered 2017-03-30 (×2): 1 mg via INTRAVENOUS
  Filled 2017-03-29 (×2): qty 1

## 2017-03-29 MED ORDER — VANCOMYCIN HCL 500 MG IV SOLR
500.0000 mg | Freq: Three times a day (TID) | INTRAVENOUS | Status: DC
  Administered 2017-03-30 – 2017-03-31 (×5): 500 mg via INTRAVENOUS
  Filled 2017-03-29 (×6): qty 500

## 2017-03-29 MED ORDER — MONTELUKAST SODIUM 10 MG PO TABS
10.0000 mg | ORAL_TABLET | Freq: Every day | ORAL | Status: DC
  Administered 2017-03-30 – 2017-03-31 (×2): 10 mg via ORAL
  Filled 2017-03-29 (×2): qty 1

## 2017-03-29 MED ORDER — ONDANSETRON HCL 4 MG/2ML IJ SOLN: 4.0000 mg | Freq: Four times a day (QID) | INTRAMUSCULAR | Status: DC | PRN

## 2017-03-29 MED ORDER — MORPHINE SULFATE (PF) 4 MG/ML IV SOLN
2.0000 mg | INTRAVENOUS | Status: DC | PRN
  Administered 2017-03-30 (×2): 2 mg via INTRAVENOUS
  Filled 2017-03-29 (×2): qty 1

## 2017-03-29 MED ORDER — PIPERACILLIN-TAZOBACTAM 3.375 G IVPB
3.3750 g | Freq: Three times a day (TID) | INTRAVENOUS | Status: DC
  Administered 2017-03-29 – 2017-03-31 (×5): 3.375 g via INTRAVENOUS
  Filled 2017-03-29 (×5): qty 50

## 2017-03-29 MED ORDER — LIDOCAINE HCL (PF) 1 % IJ SOLN
10.0000 mg | Freq: Once | INTRAMUSCULAR | Status: AC
  Administered 2017-03-29: 10 mg
  Filled 2017-03-29: qty 30

## 2017-03-29 MED ORDER — VANCOMYCIN HCL IN DEXTROSE 1-5 GM/200ML-% IV SOLN
1000.0000 mg | Freq: Once | INTRAVENOUS | Status: AC
  Administered 2017-03-29: 1000 mg via INTRAVENOUS
  Filled 2017-03-29: qty 200

## 2017-03-29 MED ORDER — ENOXAPARIN SODIUM 40 MG/0.4ML ~~LOC~~ SOLN
40.0000 mg | SUBCUTANEOUS | Status: DC
  Administered 2017-03-29 – 2017-03-30 (×2): 40 mg via SUBCUTANEOUS
  Filled 2017-03-29 (×2): qty 0.4

## 2017-03-29 MED ORDER — HYDROMORPHONE HCL 1 MG/ML IJ SOLN
1.0000 mg | Freq: Once | INTRAMUSCULAR | Status: AC
  Administered 2017-03-29: 1 mg via INTRAVENOUS
  Filled 2017-03-29: qty 1

## 2017-03-29 MED ORDER — SODIUM CHLORIDE 0.9 % IV BOLUS (SEPSIS)
500.0000 mL | Freq: Once | INTRAVENOUS | Status: AC
  Administered 2017-03-29: 500 mL via INTRAVENOUS

## 2017-03-29 MED ORDER — SODIUM CHLORIDE 0.9 % IV SOLN
INTRAVENOUS | Status: DC
  Administered 2017-03-29 (×2): via INTRAVENOUS

## 2017-03-29 MED ORDER — ACETAMINOPHEN 325 MG PO TABS: 650.0000 mg | ORAL_TABLET | Freq: Four times a day (QID) | ORAL | Status: DC | PRN

## 2017-03-29 MED ORDER — DOCUSATE SODIUM 100 MG PO CAPS
100.0000 mg | ORAL_CAPSULE | Freq: Two times a day (BID) | ORAL | Status: DC
  Administered 2017-03-29 – 2017-03-31 (×4): 100 mg via ORAL
  Filled 2017-03-29 (×4): qty 1

## 2017-03-29 MED ORDER — PIPERACILLIN-TAZOBACTAM 3.375 G IVPB 30 MIN
3.3750 g | Freq: Once | INTRAVENOUS | Status: AC
  Administered 2017-03-29: 3.375 g via INTRAVENOUS
  Filled 2017-03-29: qty 50

## 2017-03-29 MED ORDER — ACETAMINOPHEN 650 MG RE SUPP: 650.0000 mg | Freq: Four times a day (QID) | RECTAL | Status: DC | PRN

## 2017-03-29 NOTE — ED Notes (Signed)
ED TO INPATIENT HANDOFF REPORT  Name/Age/Gender Daniel [name] 57 y.o. male  Code Status Code Status History    Date Active Date Inactive Code Status Order ID Comments User Context   11/16/2014 23:01 11/20/2014 17:29 Full Code [id number]  Jovita Kussmaul, MD Inpatient      Home/SNF/Other Home  Chief Complaint testicular infection  Level of Care/Admitting Diagnosis ED Disposition    ED Disposition Condition [facility] [facility] Area: [facility] [100102]  Level of Care: Telemetry [5]  Admit to tele based on following criteria: Other see comments  Comments: sepsis  Diagnosis: Scrotal abscess [945038]  Admitting Physician: Elwyn Reach [[id number]]  Attending Physician: Elwyn Reach [[id number]]  Estimated length of stay: 3 - 4 days  Certification:: I certify this patient will need inpatient services for at least 2 midnights  PT Class (Do Not Modify): Inpatient [101]  PT Acc Code (Do Not Modify): Private [1]       Medical History Past Medical History:  Diagnosis Date  . Arthritis   . COPD (chronic obstructive pulmonary disease) ([place])   . Hypertension     Allergies No Known Allergies  IV Location/Drains/Wounds Patient Lines/Drains/Airways Status   Active Line/Drains/Airways    Name:   Placement date:   Placement time:   Site:   Days:   Peripheral IV 03/29/17 Right Forearm   03/29/17    1517    Forearm   less than 1   Peripheral IV 03/29/17 Left Forearm   03/29/17    1637    Forearm   less than 1   Wound / Incision (Open or Dehisced) 11/16/14 Laceration Other (Comment) Medial   11/16/14    2300    Other (Comment)   864          Labs/Imaging Results for orders placed or performed during the hospital encounter of 03/29/17 (from the past 48 hour(s))  Urinalysis, Routine w reflex microscopic- may I&O cath if menses     Status: Abnormal   Collection Time: 03/29/17  1:20 PM  Result Value Ref Range   Color, Urine YELLOW YELLOW   APPearance CLEAR  CLEAR   Specific Gravity, Urine 1.004 (L) 1.005 - 1.030   pH 6.0 5.0 - 8.0   Glucose, UA NEGATIVE NEGATIVE mg/dL   Hgb urine dipstick SMALL (A) NEGATIVE   Bilirubin Urine NEGATIVE NEGATIVE   Ketones, ur NEGATIVE NEGATIVE mg/dL   Protein, ur NEGATIVE NEGATIVE mg/dL   Nitrite NEGATIVE NEGATIVE   Leukocytes, UA NEGATIVE NEGATIVE   RBC / HPF NONE SEEN 0 - 5 RBC/hpf   WBC, UA 0-5 0 - 5 WBC/hpf   Bacteria, UA NONE SEEN NONE SEEN   Squamous Epithelial / LPF 0-5 (A) NONE SEEN    Comment: Performed at [facility], [place] [address], [place], [place] [address]  CBC with Differential/Platelet     Status: Abnormal   Collection Time: 03/29/17  2:56 PM  Result Value Ref Range   WBC 14.9 (H) 4.0 - 10.5 K/uL   RBC 3.85 (L) 4.22 - 5.81 MIL/uL   Hemoglobin 12.4 (L) 13.0 - 17.0 g/dL   HCT 36.3 (L) 39.0 - 52.0 %   MCV 94.3 78.0 - 100.0 fL   MCH 32.2 26.0 - 34.0 pg   MCHC 34.2 30.0 - 36.0 g/dL   RDW 14.6 11.5 - 15.5 %   Platelets 410 (H) 150 - 400 K/uL   Neutrophils Relative % 81 %  Neutro Abs 12.0 (H) 1.7 - 7.7 K/uL   Lymphocytes Relative 9 %   Lymphs Abs 1.4 0.7 - 4.0 K/uL   Monocytes Relative 9 %   Monocytes Absolute 1.4 (H) 0.1 - 1.0 K/uL   Eosinophils Relative 1 %   Eosinophils Absolute 0.1 0.0 - 0.7 K/uL   Basophils Relative 0 %   Basophils Absolute 0.0 0.0 - 0.1 K/uL    Comment: Performed at [facility], [place] [address], [place], [place] [address]  Culture, blood (Routine X 2) w Reflex to ID Panel     Status: None (Preliminary result)   Collection Time: 03/29/17  2:56 PM  Result Value Ref Range   Specimen Description BLOOD LEFT ANTECUBITAL    Special Requests      BOTTLES DRAWN AEROBIC AND ANAEROBIC Blood Culture adequate volume Performed at [facility] [address], [place], [place] [address]    Culture PENDING    Report Status PENDING   Lactic acid, plasma     Status: Abnormal   Collection Time: 03/29/17  2:56 PM   Result Value Ref Range   Lactic Acid, Venous 2.2 (HH) 0.5 - 1.9 mmol/L    Comment: CRITICAL RESULT CALLED TO, READ BACK BY AND VERIFIED WITH: DOWD,P AT 3:55PM ON 03/29/17 BY Derrill Memo Performed at [facility], [place] [address], [place], [place] [address]   Comprehensive metabolic panel     Status: Abnormal   Collection Time: 03/29/17  2:56 PM  Result Value Ref Range   Sodium 136 135 - 145 mmol/L   Potassium 3.1 (L) 3.5 - 5.1 mmol/L   Chloride 99 (L) 101 - 111 mmol/L   CO2 26 22 - 32 mmol/L   Glucose, Bld 111 (H) 65 - 99 mg/dL   BUN 6 6 - 20 mg/dL   Creatinine, Ser 0.62 0.61 - 1.24 mg/dL   Calcium 8.4 (L) 8.9 - 10.3 mg/dL   Total Protein 6.8 6.5 - 8.1 g/dL   Albumin 3.2 (L) 3.5 - 5.0 g/dL   AST 20 15 - 41 U/L   ALT 13 (L) 17 - 63 U/L   Alkaline Phosphatase 77 38 - 126 U/L   Total Bilirubin 0.3 0.3 - 1.2 mg/dL   GFR calc non Af Amer >60 >60 mL/min   GFR calc Af Amer >60 >60 mL/min    Comment: (NOTE) The eGFR has been calculated using the CKD EPI equation. This calculation has not been validated in all clinical situations. eGFR's persistently <60 mL/min signify possible Chronic Kidney Disease.    Anion gap 11 5 - 15    Comment: Performed at [facility], [place] [address] [place], [place] [address]  Culture, blood (Routine X 2) w Reflex to ID Panel     Status: None (Preliminary result)   Collection Time: 03/29/17  2:58 PM  Result Value Ref Range   Specimen Description BLOOD RIGHT ANTECUBITAL    Special Requests      IN PEDIATRIC BOTTLE Blood Culture adequate volume Performed at [facility] [address] [place], [place] [address]    Culture PENDING    Report Status PENDING    US Scrotum W/doppler  Result Date: 03/29/2017 CLINICAL DATA:  Right scrotal pain and enlargement EXAM: SCROTAL ULTRASOUND DOPPLER ULTRASOUND OF THE TESTICLES TECHNIQUE: Complete ultrasound examination of the testicles, epididymis, and other  scrotal structures was performed. Color and spectral Doppler ultrasound were also utilized to evaluate blood flow to the testicles.  COMPARISON:  None. FINDINGS: Right testicle Right testicle is not clearly visualized by today's exam. Right hemiscrotum is enlarged by a complex septated irregular fluid collection measuring 6.5 x 4.9 x 5.6 cm concerning for development of a right scrotal abscess possibly displacing the right testicle. This is a significant change compared to 02/20/2017. Left testicle Measurements: 3.7 x 2.1 x 6.4 cm. No mass or microlithiasis visualized. Right epididymis:  Not clearly delineated Left epididymis:  Normal in size and appearance. Hydrocele:  None visualized. Varicocele:  None visualized. Pulsed Doppler interrogation of left testicle demonstrates normal low resistance arterial and venous waveforms bilaterally. IMPRESSION: Abnormal right hemi scrotum complex septated irregular fluid collection measuring up to 6.5 cm concerning for right scrotal abscess obscuring and displacing the right testicle and epididymis when compared to the prior study. These results were called by telephone at the time of interpretation on 03/29/2017 at 4:35 pm to Dr. Vanita Panda, who verbally acknowledged these results. Electronically Signed   By: Jerilynn Mages.  Shick M.D.   On: 03/29/2017 16:37    Pending Labs Unresulted Labs (From admission, onward)   Start     Ordered   03/30/17 0500  Creatinine, serum  Daily,   R     03/29/17 1846   03/29/17 1853  Hemoglobin A1c  Once,   R     03/29/17 1852   03/29/17 1821  Lactic acid, plasma  STAT,   R     03/29/17 1820   03/29/17 1605  Urine culture  STAT,   STAT     03/29/17 1605   Signed and Held  HIV antibody (Routine Testing)  Once,   R     Signed and Held   Signed and Held  CBC  (enoxaparin (LOVENOX)    CrCl >/= 30 ml/min)  Once,   R    Comments:  Baseline for enoxaparin therapy IF NOT ALREADY DRAWN.  Notify MD if PLT < 100 K.    Signed and Held   Signed and Held   Creatinine, serum  (enoxaparin (LOVENOX)    CrCl >/= 30 ml/min)  Once,   R    Comments:  Baseline for enoxaparin therapy IF NOT ALREADY DRAWN.    Signed and Held   Signed and Held  Creatinine, serum  (enoxaparin (LOVENOX)    CrCl >/= 30 ml/min)  Weekly,   R    Comments:  while on enoxaparin therapy    Signed and Held   Signed and Held  Comprehensive metabolic panel  Tomorrow morning,   R     Signed and Held   Signed and Held  CBC  Tomorrow morning,   R     Signed and Held      Vitals/Pain Today's Vitals   03/29/17 1639 03/29/17 1647 03/29/17 1652 03/29/17 1746  BP: (!) 151/91   (!) 152/87  Pulse: 96   99  Resp: (!) 23   (!) 30  Temp:   (!) 102.2 F (39 C)   TempSrc:   Oral   SpO2: 98%   98%  Weight:      Height:      PainSc:  8       Isolation Precautions No active isolations  Medications Medications  0.9 %  sodium chloride infusion ( Intravenous New Bag/Given 03/29/17 1526)  piperacillin-tazobactam (ZOSYN) IVPB 3.375 g (not administered)  vancomycin (VANCOCIN) 500 mg in sodium chloride 0.9 % 100 mL IVPB (not administered)  sodium chloride 0.9 % bolus 1,000 mL (0 mLs  Intravenous Stopped 03/29/17 1636)  sodium chloride 0.9 % bolus 1,000 mL (0 mLs Intravenous Stopped 03/29/17 1712)    And  sodium chloride 0.9 % bolus 500 mL (0 mLs Intravenous Stopped 03/29/17 1712)    And  sodium chloride 0.9 % bolus 250 mL (0 mLs Intravenous Stopped 03/29/17 1752)  piperacillin-tazobactam (ZOSYN) IVPB 3.375 g (0 g Intravenous Stopped 03/29/17 1655)  vancomycin (VANCOCIN) IVPB 1000 mg/200 mL premix (0 mg Intravenous Stopped 03/29/17 1739)  lidocaine (PF) (XYLOCAINE) 1 % injection 10 mg (10 mg Other Given 03/29/17 1836)  HYDROmorphone (DILAUDID) injection 1 mg (1 mg Intravenous Given 03/29/17 1837)    Mobility Walks

## 2017-03-29 NOTE — ED Notes (Signed)
UROLOGY AT BEDSIDE TO PERFORM I & D TO SCROTUM. PT VERBAL CONSENTED TO PROCEDURE.

## 2017-03-29 NOTE — ED Triage Notes (Signed)
Patient here with complaints of right testicular pain. Reports hx of same, treated with antibiotics, patient states "3 different antibiotics.

## 2017-03-29 NOTE — Procedures (Signed)
Procedure note  Indications: Scrotal abscess    Surgeon: Jonna Clark, MD  Assistants: Dr. Nicolette Bang  Procedure Details:  Informed consent was obtained. 5cc of 1% lidocaine without epinephrine was injected superficially surrounding planned incision site on the scrotal wall . Patient was placed in the supine prosition, scrotum was prepped with betadine and draped in the usual sterile fashion. A 3cm horizontal incision was made on the anterior superior right hemiscrotum, along the superior portion of the abscess. Skin and subq tissue was incised sharply with a scalpel, and the abscess cavity was entered. Significant pus around 200cc was evacuated. Suction tip was used to break up cavity loculations in all directions surrounding the incision. The abscess cavity was around 6cm x 6cm in total area. Once completely evacuated, 1" iodoform gauze was used to pack the cavity. Almost an entire bottle was used. Scrotal fluffs and mesh panties were placed. There was excellent hemostasis once the procedure was complete.   Complication: None, patient tolerated the procedure well   Plan:  1. See urology consult note, essentially daily packing of abscess cavity until resolution and antibiotics will be needed for treatment  Attending Attestation: Dr. Racheal Patches was present for the entirety of the procedure.   ---- Jonna Clark, MD Urologic Surgery Resident

## 2017-03-29 NOTE — ED Notes (Signed)
ED Provider at bedside. [email or web address]

## 2017-03-29 NOTE — H&P (Signed)
History and Physical    Daniel [name] GMW:[id number] DOB: Dec 25, 1960 DOA: 03/29/2017  Referring MD/NP/PA: Dr. Vanita Panda  PCP: Synthia Innocent Audrea Muscat, NP   Outpatient Specialists: None  Patient coming from: Home  Chief Complaint: Scrotal pain and swelling  HPI: Daniel [name] is a 57 y.o. male with medical history significant of hypertension, COPD and alcoholism presenting with swelling of his scrotum which has been going on and off for 2-3 months. Patient has previous diagnosis of epididymitis on January 10. He has scrotal ultrasound at the time and was given ciprofloxacin for 10 days but no improvement. He has previous history of inguinal hand on the right first toe many years which has been reduced so far. Patient is homeless and therefore has not has significant medical follow-up. He came to the ER today with fever and malaise and chills. Also having body aches. No urinary symptoms. Initial evaluation in the ER showed no evidence of UTI but he has significant swelling of the right part of his testicle. Patient is therefore diagnosed with scrotal abscess. He has had I&D in the ER by urology. He is being admitted for father workup. Patient continues to smoke at least a pack per day and drinks 2 40 ounce of Beer daily.   ED Course: Patient was seen by urology. Incision and drainage was done. Initiated on antibiotics and being admitted to the hospital. He has SIRS symptoms with elevated lactic acid level and evidence of infection so he was diagnosed with sepsis. Temperature is 102 and white count is 14,000.  Review of Systems: As per HPI otherwise 10 point review of systems negative.    Past Medical History:  Diagnosis Date  . Arthritis   . COPD (chronic obstructive pulmonary disease) ([place])   . Hypertension     Past Surgical History:  Procedure Laterality Date  . HAND SURGERY Right      reports that he has been smoking cigarettes.  He has been smoking about 1.50 packs per day. he has never used  smokeless tobacco. He reports that he drinks alcohol. He reports that he does not use drugs.  No Known Allergies  No family history on file.   Prior to Admission medications   Medication Sig Start Date End Date Taking? Authorizing Provider  cloNIDine (CATAPRES) 0.1 MG tablet Take 0.1 mg by mouth at bedtime.    Yes [provider]  loratadine (CLARITIN) 10 MG tablet Take 10 mg by mouth daily.   Yes [provider]  meloxicam (MOBIC) 15 MG tablet Take 15 mg by mouth daily.   Yes [provider]  montelukast (SINGULAIR) 10 MG tablet Take 10 mg by mouth daily.   Yes [provider]  ciprofloxacin (CIPRO) 500 MG tablet Take 1 tablet (500 mg total) by mouth every 12 (twelve) hours. Patient not taking: Reported on 03/29/2017 02/20/17   Tanna Furry, MD  HYDROcodone-acetaminophen (NORCO/VICODIN) 5-325 MG tablet Take 1 tablet by mouth every 4 (four) hours as needed. Patient not taking: Reported on 03/29/2017 02/20/17   Tanna Furry, MD  levofloxacin (LEVAQUIN) 500 MG tablet Take 500 mg by mouth daily.    [provider]    Physical Exam: Vitals:   03/29/17 1652 03/29/17 1746 03/29/17 1845 03/29/17 1938  BP:  (!) 152/87 (!) 154/90 115/72  Pulse:  99 (!) 113 (!) 108  Resp:  (!) 30 20 (!) 31  Temp: (!) 102.2 F (39 C)     TempSrc: Oral     SpO2:  98% 95% 93%  Weight:      Height:          Constitutional: NAD, calm, comfortable Vitals:   03/29/17 1652 03/29/17 1746 03/29/17 1845 03/29/17 1938  BP:  (!) 152/87 (!) 154/90 115/72  Pulse:  99 (!) 113 (!) 108  Resp:  (!) 30 20 (!) 31  Temp: (!) 102.2 F (39 C)     TempSrc: Oral     SpO2:  98% 95% 93%  Weight:      Height:       Eyes: PERRL, lids and conjunctivae normal ENMT: Mucous membranes are moist. Posterior pharynx clear of any exudate or lesions.Normal dentition.  Neck: normal, supple, no masses, no thyromegaly Respiratory: clear to auscultation bilaterally, no wheezing, no crackles.  Normal respiratory effort. No accessory muscle use.  Cardiovascular: Regular rate and rhythm, no murmurs / rubs / gallops. No extremity edema. 2+ pedal pulses. No carotid bruits.  Abdomen: no tenderness, no masses palpated. No hepatosplenomegaly. Bowel sounds positive.  Musculoskeletal: no clubbing / cyanosis. No joint deformity upper and lower extremities. Good ROM, no contractures. Normal muscle tone.  Skin: Scrotal skin is distended red and warm Neurologic: CN 2-12 grossly intact. Sensation intact, DTR normal. Strength 5/5 in all 4.  Psychiatric: Normal judgment and insight. Alert and oriented x 3. Normal mood.  GU: Swollen scrotum tender to touch red and warm  Labs on Admission: I have personally reviewed following labs and imaging studies  CBC: Recent Labs  Lab 03/29/17 1456  WBC 14.9*  NEUTROABS 12.0*  HGB 12.4*  HCT 36.3*  MCV 94.3  PLT 229*   Basic Metabolic Panel: Recent Labs  Lab 03/29/17 1456  NA 136  K 3.1*  CL 99*  CO2 26  GLUCOSE 111*  BUN 6  CREATININE 0.62  CALCIUM 8.4*   GFR: Estimated Creatinine Clearance: 73.4 mL/min (by C-G formula based on SCr of 0.62 mg/dL). Liver Function Tests: Recent Labs  Lab 03/29/17 1456  AST 20  ALT 13*  ALKPHOS 77  BILITOT 0.3  PROT 6.8  ALBUMIN 3.2*   No results for input(s): LIPASE, AMYLASE in the last 168 hours. No results for input(s): AMMONIA in the last 168 hours. Coagulation Profile: No results for input(s): INR, PROTIME in the last 168 hours. Cardiac Enzymes: No results for input(s): CKTOTAL, CKMB, CKMBINDEX, TROPONINI in the last 168 hours. BNP (last 3 results) No results for input(s): PROBNP in the last 8760 hours. HbA1C: No results for input(s): HGBA1C in the last 72 hours. CBG: No results for input(s): GLUCAP in the last 168 hours. Lipid Profile: No results for input(s): CHOL, HDL, LDLCALC, TRIG, CHOLHDL, LDLDIRECT in the last 72 hours. Thyroid Function Tests: No results for input(s): TSH,  T4TOTAL, FREET4, T3FREE, THYROIDAB in the last 72 hours. Anemia Panel: No results for input(s): VITAMINB12, FOLATE, FERRITIN, TIBC, IRON, RETICCTPCT in the last 72 hours. Urine analysis:    Component Value Date/Time   COLORURINE YELLOW 03/29/2017 1320   APPEARANCEUR CLEAR 03/29/2017 1320   LABSPEC 1.004 (L) 03/29/2017 1320   PHURINE 6.0 03/29/2017 1320   GLUCOSEU NEGATIVE 03/29/2017 1320   HGBUR SMALL (A) 03/29/2017 1320   BILIRUBINUR NEGATIVE 03/29/2017 1320   KETONESUR NEGATIVE 03/29/2017 1320   PROTEINUR NEGATIVE 03/29/2017 1320   UROBILINOGEN 0.2 11/29/2007 1800   NITRITE NEGATIVE 03/29/2017 1320   LEUKOCYTESUR NEGATIVE 03/29/2017 1320   Sepsis Labs: @LABRCNTIP (procalcitonin:4,lacticidven:4) ) Recent Results (from the past 240 hour(s))  Culture, blood (Routine X 2) w Reflex to  ID Panel     Status: None (Preliminary result)   Collection Time: 03/29/17  2:56 PM  Result Value Ref Range Status   Specimen Description BLOOD LEFT ANTECUBITAL  Final   Special Requests   Final    BOTTLES DRAWN AEROBIC AND ANAEROBIC Blood Culture adequate volume Performed at [facility] [address], [place], [place] [address]    Culture PENDING  Incomplete   Report Status PENDING  Incomplete  Culture, blood (Routine X 2) w Reflex to ID Panel     Status: None (Preliminary result)   Collection Time: 03/29/17  2:58 PM  Result Value Ref Range Status   Specimen Description BLOOD RIGHT ANTECUBITAL  Final   Special Requests   Final    IN PEDIATRIC BOTTLE Blood Culture adequate volume Performed at [facility] [address] [place], [place] [address]    Culture PENDING  Incomplete   Report Status PENDING  Incomplete     Radiological Exams on Admission: US Scrotum W/doppler  Result Date: 03/29/2017 CLINICAL DATA:  Right scrotal pain and enlargement EXAM: SCROTAL ULTRASOUND DOPPLER ULTRASOUND OF THE TESTICLES TECHNIQUE: Complete ultrasound examination of  the testicles, epididymis, and other scrotal structures was performed. Color and spectral Doppler ultrasound were also utilized to evaluate blood flow to the testicles. COMPARISON:  None. FINDINGS: Right testicle Right testicle is not clearly visualized by today's exam. Right hemiscrotum is enlarged by a complex septated irregular fluid collection measuring 6.5 x 4.9 x 5.6 cm concerning for development of a right scrotal abscess possibly displacing the right testicle. This is a significant change compared to 02/20/2017. Left testicle Measurements: 3.7 x 2.1 x 6.4 cm. No mass or microlithiasis visualized. Right epididymis:  Not clearly delineated Left epididymis:  Normal in size and appearance. Hydrocele:  None visualized. Varicocele:  None visualized. Pulsed Doppler interrogation of left testicle demonstrates normal low resistance arterial and venous waveforms bilaterally. IMPRESSION: Abnormal right hemi scrotum complex septated irregular fluid collection measuring up to 6.5 cm concerning for right scrotal abscess obscuring and displacing the right testicle and epididymis when compared to the prior study. These results were called by telephone at the time of interpretation on 03/29/2017 at 4:35 pm to Dr. Vanita Panda, who verbally acknowledged these results. Electronically Signed   By: Jerilynn Mages.  Shick M.D.   On: 03/29/2017 16:37    EKG: Independently reviewed.  Assessment/Plan Principal Problem:   Scrotal abscess Active Problems:   Sepsis ([place])   COPD (chronic obstructive pulmonary disease) ([facility])   HTN (hypertension)  Alcoholism Tobacco abuse  #1 right scrotal abscess: Status post incision and drainage. I will initiate vancomycin and Zosyn pending Gram stain and culture of abscess. Appreciate urology input will continue per urology.  #2 sepsis: Secondary to scrotal abscess. Follow lactic acid level and continue with IV antibiotics. Monitor on telemetry  #3 COPD: No acute exacerbation. Continue  monitoring  #4 hypertension: Blood pressure appears controlled. Continue current management  #5 tobacco abuse: Counseling provided. Add nicotine patch  #6 history of alcoholism: Patient should be watched for possible alcohol withdrawal. When necessary Ativan for now but once he shows sign of withdrawal CIWA protocol could be initiated   DVT prophylaxis: Lovenox  Code Status: Full code  Family Communication: Wife who is with patient  Disposition Plan: To be determined  Consults called: Urology Dr. Nicolette Bang   Admission status: Inpatient   Severity of Illness: The appropriate patient status for this patient is INPATIENT. Inpatient status is judged  to be reasonable and necessary in order to provide the required intensity of service to ensure the patient's safety. The patient's presenting symptoms, physical exam findings, and initial radiographic and laboratory data in the context of their chronic comorbidities is felt to place them at high risk for further clinical deterioration. Furthermore, it is not anticipated that the patient will be medically stable for discharge from the hospital within 2 midnights of admission. The following factors support the patient status of inpatient.   " The patient's presenting symptoms include scrotal swelling and tenderness. " The worrisome physical exam findings include inflamed and swelling right scrotal area. " The initial radiographic and laboratory data are worrisome because of ultrasound showing scrotal abscess. " The chronic co-morbidities include recent epididymitis.   * I certify that at the point of admission it is my clinical judgment that the patient will require inpatient hospital care spanning beyond 2 midnights from the point of admission due to high intensity of service, high risk for further deterioration and high frequency of surveillance required.Barbette Merino MD [facility] Pager [phone]  If 7PM-7AM,  please contact night-coverage [email or web address] Password [facility]  03/29/2017, 7:43 PM

## 2017-03-29 NOTE — ED Notes (Signed)
UROLOGY MD PRESENT SPEAKING WITH PT

## 2017-03-29 NOTE — ED Notes (Signed)
PT CALLING TO HAVE SOMEONE PICK UP BELONGINGS

## 2017-03-29 NOTE — Progress Notes (Signed)
Pharmacy Antibiotic Note  Daniel [name] is a 57 y.o. male admitted on 03/29/2017 with cellulitis of scrotum.  He was initially seen on 02/14/17 & treated for epididymitis with doxycycline which was switched to 10 day course of Cipro on 02/20/17.   Presents today with swelling & redness of scrotal area with areas of serosanguineous fluid.  Pharmacy has been consulted for Vancomycin & Zosyn dosing.  Initial doses given in ED.   03/29/2017:  Tm 102.24F  Leukocytosis, elevated LA  Renal function at baseline- CrCl ~ 37ml/min  Plan: Zosyn 3.375g IV q8h (4 hour infusion).  Vancomycin 500mg  IV q8h Vancomycin (target AUC 400-500) Daily Scr Monitor renal function and cx data   Height: 5\' 7"  (170.2 cm) Weight: 111 lb (50.3 kg) IBW/kg (Calculated) : 66.1  Temp (24hrs), Avg:100 F (37.8 C), Min:98.3 F (36.8 C), Max:102.2 F (39 C)  Recent Labs  Lab 03/29/17 1456  WBC 14.9*  CREATININE 0.62  LATICACIDVEN 2.2*    Estimated Creatinine Clearance: 73.4 mL/min (by C-G formula based on SCr of 0.62 mg/dL).    No Known Allergies  Antimicrobials this admission: 2/16 Vanc >>  2/16 Zosyn >>   Dose adjustments this admission:  Microbiology results: 2/16 BCx:  2/16 UCx:    Thank you for allowing pharmacy to be a part of this patient's care.  Biagio Borg 03/29/2017 6:32 PM

## 2017-03-29 NOTE — ED Provider Notes (Signed)
[facility] DEPT Provider Note   CSN: [id number] Arrival date & time: 03/29/17  1147     History   Chief Complaint Chief Complaint  Patient presents with  . Testicle Pain    HPI Daniel [name] is a 57 y.o. male.  Patient returns with a complaint of persistent right testicular pain.  The patient states now the testicle is markedly swollen.  Patient was treated with doxycycline on January 4 and then seen here in the emergency department on January 10.  Switched to Cipro.  Ultrasound at that time was consistent with epididymitis.  Patient also has a known long period patient states there is no change in that.      Past Medical History:  Diagnosis Date  . Arthritis   . COPD (chronic obstructive pulmonary disease) ([place])   . Hypertension     Patient Active Problem List   Diagnosis Date Noted  . Fracture, thyroid cartilage closed ([place]) 11/16/2014    Past Surgical History:  Procedure Laterality Date  . HAND SURGERY Right        Home Medications    Prior to Admission medications   Medication Sig Start Date End Date Taking? Authorizing Provider  cloNIDine (CATAPRES) 0.1 MG tablet Take 0.1 mg by mouth at bedtime.    Yes [provider]  loratadine (CLARITIN) 10 MG tablet Take 10 mg by mouth daily.   Yes [provider]  meloxicam (MOBIC) 15 MG tablet Take 15 mg by mouth daily.   Yes [provider]  montelukast (SINGULAIR) 10 MG tablet Take 10 mg by mouth daily.   Yes [provider]  ciprofloxacin (CIPRO) 500 MG tablet Take 1 tablet (500 mg total) by mouth every 12 (twelve) hours. Patient not taking: Reported on 03/29/2017 02/20/17   Tanna Furry, MD  HYDROcodone-acetaminophen (NORCO/VICODIN) 5-325 MG tablet Take 1 tablet by mouth every 4 (four) hours as needed. Patient not taking: Reported on 03/29/2017 02/20/17   Tanna Furry, MD  levofloxacin (LEVAQUIN) 500 MG tablet Take 500 mg by mouth daily.    [provider]    Family History No family history on file.  Social History Social History   Tobacco Use  . Smoking status: Current Every Day Smoker    Packs/day: 1.50    Types: Cigarettes  . Smokeless tobacco: Never Used  Substance Use Topics  . Alcohol use: Yes    Comment: daily  . Drug use: No     Allergies   Patient has no known allergies.   Review of Systems Review of Systems  Constitutional: Negative for fever.  HENT: Negative for congestion.   Eyes: Negative for redness.  Respiratory: Negative for shortness of breath.   Cardiovascular: Negative for chest pain.  Gastrointestinal: Negative for abdominal pain, nausea and vomiting.  Genitourinary: Positive for scrotal swelling and testicular pain. Negative for difficulty urinating, discharge, dysuria and flank pain.  Musculoskeletal: Negative for back pain.  Skin: Negative for rash.  Neurological: Negative for headaches.  Hematological: Does not bruise/bleed easily.  Psychiatric/Behavioral: Negative for confusion.     Physical Exam Updated Vital Signs BP 121/75 (BP Location: Right Arm)   Pulse (!) 113   Temp (!) 101 F (38.3 C) (Oral)   Resp 17   Ht 1.702 m (5\' 7" )   Wt 50.3 kg (111 lb)   SpO2 99%   BMI 17.39 kg/m   Physical Exam  Constitutional: He is oriented to person, place, and time. He appears well-developed  and well-nourished. No distress.  HENT:  Head: Normocephalic and atraumatic.  Mouth/Throat: Oropharynx is clear and moist.  Eyes: Conjunctivae and EOM are normal. Pupils are equal, round, and reactive to light.  Neck: Normal range of motion. Neck supple.  Cardiovascular: Regular rhythm and normal heart sounds.  Tachycardic  Pulmonary/Chest: Effort normal and breath sounds normal. No respiratory distress.  Abdominal: Soft. Bowel sounds are normal.  Genitourinary: Penis normal.  Genitourinary Comments: Patient uncircumcised.  Foreskin easily reducible.  No discharge.  Left testicle left  scrotal area normal.  Right groin with a easily reducible inguinal hernia.  Right scrotum with significant swelling and erythema and some skin peeling.  Very tight.  Unable to palpate the testicle.  Associated with some tenderness.  Musculoskeletal: Normal range of motion.  Neurological: He is alert and oriented to person, place, and time. No cranial nerve deficit or sensory deficit. He exhibits normal muscle tone. Coordination normal.  Nursing note and vitals reviewed.    ED Treatments / Results  Labs (all labs ordered are listed, but only abnormal results are displayed) Labs Reviewed  URINALYSIS, ROUTINE W REFLEX MICROSCOPIC - Abnormal; Notable for the following components:      Result Value   Specific Gravity, Urine 1.004 (*)    Hgb urine dipstick SMALL (*)    Squamous Epithelial / LPF 0-5 (*)    All other components within normal limits  CBC WITH DIFFERENTIAL/PLATELET - Abnormal; Notable for the following components:   WBC 14.9 (*)    RBC 3.85 (*)    Hemoglobin 12.4 (*)    HCT 36.3 (*)    Platelets 410 (*)    Neutro Abs 12.0 (*)    Monocytes Absolute 1.4 (*)    All other components within normal limits  CULTURE, BLOOD (ROUTINE X 2)  CULTURE, BLOOD (ROUTINE X 2)  LACTIC ACID, PLASMA  COMPREHENSIVE METABOLIC PANEL    EKG  EKG Interpretation None       Radiology No results found.  Procedures Procedures (including critical care time)  Medications Ordered in ED Medications  0.9 %  sodium chloride infusion ( Intravenous New Bag/Given 03/29/17 1526)  sodium chloride 0.9 % bolus 1,000 mL (1,000 mLs Intravenous New Bag/Given 03/29/17 1526)     Initial Impression / Assessment and Plan / ED Course  I have reviewed the triage vital signs and the nursing notes.  Pertinent labs & imaging results that were available during my care of the patient were reviewed by me and considered in my medical decision making (see chart for details).     Patient was significant swelling  to the right scrotal area.  Unrelated to the right inguinal hernia which is easily reducible and separate from the scrotal swelling.  Patient seen here January 10 treated for epididymitis.  Patient has significant swelling of the scrotum with some erythema to it and some skin peeling.  Scrotum is very tight on that side left side normal.  Suggestive of an orchitis.  Perhaps may be fluid with abscess.  Patient nontoxic at this point.  However ultrasound is going to be important on determining specifically what is going on.  Urinalysis without significant abnormal patient has been on 2 antibiotics.  Was started on doxycycline on January 4 and was seen here on the 10th switched to Cipro.  Patient did have an ultrasound when he is seen.  Seem to be consistent with epididymitis.  Suspect that based on the ultrasound findings urology will have to be consulted.  Patient's labs are tachycardia no fever.  Nothing to meet sepsis criteria.  Oxygen saturations are good at 99 to 100%.  Final Clinical Impressions(s) / ED Diagnoses   Final diagnoses:  Scrotal swelling  Right testicular pain  Right inguinal hernia    ED Discharge Orders    None       Fredia Sorrow, MD 03/29/17 1541

## 2017-03-29 NOTE — ED Provider Notes (Signed)
Care assumed from Dr. Rogene Houston. On exam at 4:41 PM Patient is awake and alert, hemiscrotum is noticeably enlarged, erythematous, with several points of serosanguineous fluid. Patient has inguinal canal hernia, nontender.  He is receiving vancomycin, Zosyn, fluids.  I discussed patient's case with our radiologist and urologist for further evaluation and management.    Carmin Muskrat, MD 03/29/17 1650

## 2017-03-29 NOTE — ED Notes (Signed)
ED Provider at bedside.[place] 

## 2017-03-29 NOTE — Consult Note (Signed)
Urology Consult Note    Requesting Attending Physician:  Fredia Sorrow, MD Service Requesting Consult:  Emergency Departmetn Service Providing Consult: Urology  Consulting Attending: Nicolette Bang, MD   Reason for Consult:  Scrotal abscess   Daniel [name] is seen in consultation for reasons noted above at the request of Fredia Sorrow, MD in the Emergency Department service.   This is a 57 y.o. yo patient with a history of COPD, HTN, homelessness and heavy alcohol use. He presents with 2-3 months of right hemiscrotal swelling. He has had several years of a right inguinal hernia which is intermittently bothersome. On 1/10 he was diagnosed with epididymitis on scrotal ultrasound, and given a 10 day course of ciprofloxacin without improvement.   He presents today with several weeks of malaise, and several days of fevers, chills at home, fatigue, body aches and worsening right hemiscrotal pain and swelling. He denies current urinary complaints, UA negative. Does endorse a history of gross hematuria with dysuria several times while he was incarcerated over the last several months. This has never been worked up.   States he drinks 2 40oz of beer every morning (on history, answer changed from 1 per day to 2 to 2 40 oz). Denies IVDU. Endorses smoking 1 pack cigarette per day.   He ate a slice of pizza 15 minutes ago.   Past Medical History: Past Medical History:  Diagnosis Date  . Arthritis   . COPD (chronic obstructive pulmonary disease) ([place])   . Hypertension     Past Surgical History:  Past Surgical History:  Procedure Laterality Date  . HAND SURGERY Right     Medication: Current Facility-Administered Medications  Medication Dose Route Frequency Provider Last Rate Last Dose  . 0.9 %  sodium chloride infusion   Intravenous Continuous Fredia Sorrow, MD 100 mL/hr at 03/29/17 1526    . sodium chloride 0.9 % bolus 250 mL  250 mL Intravenous Once Fredia Sorrow, MD      .  vancomycin (VANCOCIN) IVPB 1000 mg/200 mL premix  1,000 mg Intravenous Once Fredia Sorrow, MD 200 mL/hr at 03/29/17 1639 1,000 mg at 03/29/17 1639   Current Outpatient Medications  Medication Sig Dispense Refill  . cloNIDine (CATAPRES) 0.1 MG tablet Take 0.1 mg by mouth at bedtime.     Marland Kitchen loratadine (CLARITIN) 10 MG tablet Take 10 mg by mouth daily.    . meloxicam (MOBIC) 15 MG tablet Take 15 mg by mouth daily.    . montelukast (SINGULAIR) 10 MG tablet Take 10 mg by mouth daily.    . ciprofloxacin (CIPRO) 500 MG tablet Take 1 tablet (500 mg total) by mouth every 12 (twelve) hours. (Patient not taking: Reported on 03/29/2017) 20 tablet 0  . HYDROcodone-acetaminophen (NORCO/VICODIN) 5-325 MG tablet Take 1 tablet by mouth every 4 (four) hours as needed. (Patient not taking: Reported on 03/29/2017) 10 tablet 0  . levofloxacin (LEVAQUIN) 500 MG tablet Take 500 mg by mouth daily.      Allergies: No Known Allergies  Social History: Social History   Tobacco Use  . Smoking status: Current Every Day Smoker    Packs/day: 1.50    Types: Cigarettes  . Smokeless tobacco: Never Used  Substance Use Topics  . Alcohol use: Yes    Comment: daily  . Drug use: No    Family History No family history on file.  Review of Systems 10 systems were reviewed and are negative except as noted specifically in the HPI.  Objective  Vital signs in last 24 hours: BP (!) 151/91   Pulse 96   Temp (!) 102.2 F (39 C) (Oral)   Resp (!) 23   Ht 5\' 7"  (1.702 m)   Wt 50.3 kg (111 lb)   SpO2 98%   BMI 17.39 kg/m   Intake/Output last 3 shifts: No intake/output data recorded.  Physical Exam General: NAD, A&O, resting, appropriate HEENT: [place]/AT, EOMI, MMM Pulmonary: Normal work of breathing on RA Cardiovascular: HDS, adequate peripheral perfusion Abdomen: soft, NTTP, nondistended, no suprapubic fullness or tenderness GU: uncircumsized male phallus. Large scrotal abscess overlying right hemiscrotum.  Unable to palpate right testicle. Left testicle palpated normally. No swelling, induration on left hemiscrotum. No drainage from right hemiscrotum.  Extremities: warm and well perfused, no edema Neuro: Appropriate, no focal neurological deficits  Most Recent Labs: Lab Results  Component Value Date   WBC 14.9 (H) 03/29/2017   HGB 12.4 (L) 03/29/2017   HCT 36.3 (L) 03/29/2017   PLT 410 (H) 03/29/2017    Lab Results  Component Value Date   NA 136 03/29/2017   K 3.1 (L) 03/29/2017   CL 99 (L) 03/29/2017   CO2 26 03/29/2017   BUN 6 03/29/2017   CREATININE 0.62 03/29/2017   CALCIUM 8.4 (L) 03/29/2017    Lab Results  Component Value Date   ALKPHOS 77 03/29/2017   BILITOT 0.3 03/29/2017   PROT 6.8 03/29/2017   ALBUMIN 3.2 (L) 03/29/2017   ALT 13 (L) 03/29/2017   AST 20 03/29/2017    Lab Results  Component Value Date   INR 1.05 11/16/2014   APTT 34 07/20/2008     Urine Culture: None    IMAGING: US Scrotum W/doppler  Result Date: 03/29/2017 CLINICAL DATA:  Right scrotal pain and enlargement EXAM: SCROTAL ULTRASOUND DOPPLER ULTRASOUND OF THE TESTICLES TECHNIQUE: Complete ultrasound examination of the testicles, epididymis, and other scrotal structures was performed. Color and spectral Doppler ultrasound were also utilized to evaluate blood flow to the testicles. COMPARISON:  None. FINDINGS: Right testicle Right testicle is not clearly visualized by today's exam. Right hemiscrotum is enlarged by a complex septated irregular fluid collection measuring 6.5 x 4.9 x 5.6 cm concerning for development of a right scrotal abscess possibly displacing the right testicle. This is a significant change compared to 02/20/2017. Left testicle Measurements: 3.7 x 2.1 x 6.4 cm. No mass or microlithiasis visualized. Right epididymis:  Not clearly delineated Left epididymis:  Normal in size and appearance. Hydrocele:  None visualized. Varicocele:  None visualized. Pulsed Doppler interrogation of left  testicle demonstrates normal low resistance arterial and venous waveforms bilaterally. IMPRESSION: Abnormal right hemi scrotum complex septated irregular fluid collection measuring up to 6.5 cm concerning for right scrotal abscess obscuring and displacing the right testicle and epididymis when compared to the prior study. These results were called by telephone at the time of interpretation on 03/29/2017 at 4:35 pm to Dr. Vanita Panda, who verbally acknowledged these results. Electronically Signed   By: Jerilynn Mages.  Shick M.D.   On: 03/29/2017 16:37    ------  Assessment:  Patient is a 57 y.o. male with hx COPD, homelessness and heavy alcohol use presenting with a right scrotal abscess, fevers and clinical signs and symptoms of sepsis.  Scrotal I&D performed at beside (see separate urology procedure note). Patient tolerated procedure well  Recommendations: 1. Daily scrotal packing using 1" iodoform gauze. First change will be performed by MD tomorrow, nursing after 2. Continue broad spectrum antibiotics, will need for at least  7 days  3. Appreciate medicine assistance with management of this patient    Thank you for this consult. Please contact the urology consult pager with any further questions/concerns.  Jonna Clark, MD Urology Surgical Resident  -----

## 2017-03-29 NOTE — Progress Notes (Signed)
A consult was received from an ED physician for [place] per pharmacy dosing.  The patient's profile has been reviewed for ht/wt/allergies/indication/available labs.   A one time order has been placed for Vancomycin 1gm IV & Zosyn 3.375gm IV.  Further antibiotics/pharmacy consults should be ordered by admitting physician if indicated.                       Thank you, Biagio Borg 03/29/2017  4:20 PM

## 2017-03-30 ENCOUNTER — Other Ambulatory Visit: Payer: Self-pay

## 2017-03-30 LAB — CBC
HCT: 31.3 % — ABNORMAL LOW (ref 39.0–52.0)
Hemoglobin: 10.5 g/dL — ABNORMAL LOW (ref 13.0–17.0)
MCH: 31.8 pg (ref 26.0–34.0)
MCHC: 33.5 g/dL (ref 30.0–36.0)
MCV: 94.8 fL (ref 78.0–100.0)
PLATELETS: 403 10*3/uL — AB (ref 150–400)
RBC: 3.3 MIL/uL — ABNORMAL LOW (ref 4.22–5.81)
RDW: 14.6 % (ref 11.5–15.5)
WBC: 13.7 10*3/uL — AB (ref 4.0–10.5)

## 2017-03-30 LAB — COMPREHENSIVE METABOLIC PANEL
ALT: 13 U/L — AB (ref 17–63)
AST: 16 U/L (ref 15–41)
Albumin: 2.7 g/dL — ABNORMAL LOW (ref 3.5–5.0)
Alkaline Phosphatase: 60 U/L (ref 38–126)
Anion gap: 5 (ref 5–15)
BILIRUBIN TOTAL: 0.4 mg/dL (ref 0.3–1.2)
BUN: 8 mg/dL (ref 6–20)
CO2: 26 mmol/L (ref 22–32)
Calcium: 7.9 mg/dL — ABNORMAL LOW (ref 8.9–10.3)
Chloride: 106 mmol/L (ref 101–111)
Creatinine, Ser: 0.64 mg/dL (ref 0.61–1.24)
GFR calc Af Amer: >60 mL/min (ref >60)
Glucose, Bld: 83 mg/dL (ref 65–99)
Potassium: 3.6 mmol/L (ref 3.5–5.1)
Sodium: 137 mmol/L (ref 135–145)
TOTAL PROTEIN: 5.6 g/dL — AB (ref 6.5–8.1)

## 2017-03-30 LAB — MRSA PCR SCREENING: MRSA by PCR: NEGATIVE

## 2017-03-30 LAB — URINE CULTURE: Culture: NO GROWTH

## 2017-03-30 LAB — HIV ANTIBODY (ROUTINE TESTING W REFLEX): HIV Screen 4th Generation wRfx: NONREACTIVE

## 2017-03-30 LAB — HEMOGLOBIN A1C
Hgb A1c MFr Bld: 5.6 % (ref 4.8–5.6)
MEAN PLASMA GLUCOSE: 114.02 mg/dL

## 2017-03-30 MED ORDER — ALBUTEROL SULFATE HFA 108 (90 BASE) MCG/ACT IN AERS: 1.0000 | INHALATION_SPRAY | Freq: Four times a day (QID) | RESPIRATORY_TRACT | Status: DC | PRN

## 2017-03-30 MED ORDER — ALBUTEROL SULFATE (2.5 MG/3ML) 0.083% IN NEBU
2.5000 mg | INHALATION_SOLUTION | Freq: Four times a day (QID) | RESPIRATORY_TRACT | Status: DC | PRN
  Administered 2017-03-30: 2.5 mg via RESPIRATORY_TRACT
  Filled 2017-03-30: qty 3

## 2017-03-30 NOTE — Progress Notes (Signed)
MRSA swab negative, contact precautions discontinued as per protocol.

## 2017-03-30 NOTE — Progress Notes (Signed)
Urology Progress Note      Subjective: 57 yo M hx COPD, homelessness and heavy alcohol use who presented to ED 2/16 with a right scrotal abscess, fevers and clinical signs and symptoms of sepsis. S/p bedside I&D yesterday, evacuated 200cc pus, abscess cavity packed.   This morning patient states he feels much better. Pain improved in scrotum. Continued to be febrile overnight, tachycardic, defervesced this AM. BP wnl. WBC slightly improved. On vanc zosyn. Denies dizziness, lightheadedness. Pain well controlled.   Objective: Vital signs in last 24 hours: Temp:  [98.3 F (36.8 C)-102.2 F (39 C)] 99.5 F (37.5 C) (02/17 0539) Pulse Rate:  [77-115] 77 (02/17 0539) Resp:  [17-31] 18 (02/17 0539) BP: (115-154)/(72-91) 130/81 (02/17 0539) SpO2:  [93 %-100 %] 96 % (02/17 0539) Weight:  [50.3 kg (111 lb)-65.3 kg (143 lb 15.4 oz)] 65.3 kg (143 lb 15.4 oz) (02/16 2000)  Intake/Output from previous day: 02/16 0701 - 02/17 0700 In: 6000 [I.V.:3000; IV Piggyback:3000] Out: 400 [Urine:400] Intake/Output this shift: No intake/output data recorded.  Physical Exam:  General: Alert and oriented CV: RRR Lungs: audibly congested this AM, with cough  Abdomen: Soft,  GU: Right hemiscrotum with thickened wall, swelling. Improved exam compared to yesterday. No acute weeping, bleeding. Abscess cavity smaller today, ~4cm.  Ext: NT, No erythema  Lab Results: Recent Labs    03/29/17 1456 03/30/17 0414  HGB 12.4* 10.5*  HCT 36.3* 31.3*   BMET Recent Labs    03/29/17 1456 03/30/17 0414  NA 136 137  K 3.1* 3.6  CL 99* 106  CO2 26 26  GLUCOSE 111* 83  BUN 6 8  CREATININE 0.62 0.64  CALCIUM 8.4* 7.9*     Studies/Results: US Scrotum W/doppler  Result Date: 03/29/2017 CLINICAL DATA:  Right scrotal pain and enlargement EXAM: SCROTAL ULTRASOUND DOPPLER ULTRASOUND OF THE TESTICLES TECHNIQUE: Complete ultrasound examination of the testicles, epididymis, and other scrotal structures was  performed. Color and spectral Doppler ultrasound were also utilized to evaluate blood flow to the testicles. COMPARISON:  None. FINDINGS: Right testicle Right testicle is not clearly visualized by today's exam. Right hemiscrotum is enlarged by a complex septated irregular fluid collection measuring 6.5 x 4.9 x 5.6 cm concerning for development of a right scrotal abscess possibly displacing the right testicle. This is a significant change compared to 02/20/2017. Left testicle Measurements: 3.7 x 2.1 x 6.4 cm. No mass or microlithiasis visualized. Right epididymis:  Not clearly delineated Left epididymis:  Normal in size and appearance. Hydrocele:  None visualized. Varicocele:  None visualized. Pulsed Doppler interrogation of left testicle demonstrates normal low resistance arterial and venous waveforms bilaterally. IMPRESSION: Abnormal right hemi scrotum complex septated irregular fluid collection measuring up to 6.5 cm concerning for right scrotal abscess obscuring and displacing the right testicle and epididymis when compared to the prior study. These results were called by telephone at the time of interpretation on 03/29/2017 at 4:35 pm to Dr. Vanita Panda, who verbally acknowledged these results. Electronically Signed   By: Jerilynn Mages.  Shick M.D.   On: 03/29/2017 16:37    Assessment/Plan:  57 y.o. male s/p R hemiscrotal abscess drainage 2/16. Packing changed with nursing at bedside by urology team this AM. Cavity packed with 1" iodoform, improved exam with significantly smaller size of cavity compared to yesterday. Patient seems to be progressing well  - continue IV antibiotics until patients fevers and vital sign abnormalities have defervesced for 24 hours - once improved clinically, switch patient to PO abx (Bactrim) for  total 7 day course  - daily dressing changes ok to be performed by nursing: 1 or 1/2 inch iodoform for packing, kerlex scrotal support, abd pad and mesh panties. Fiance and patient will need to be  able to do this prior to discharge  -appreciate hospitalist assistance with management of this patient    LOS: 1 day   Daniel [name] 03/30/2017, 8:49 AM

## 2017-03-30 NOTE — Progress Notes (Signed)
PROGRESS NOTE    Daniel [name]  VEL:[id number] DOB: Jun 16, 1960 DOA: 03/29/2017 PCP: Marliss Coots, NP    Brief Narrative:  57 y.o. male with medical history significant of hypertension, COPD and alcoholism presenting with swelling of his scrotum which has been going on and off for 2-3 months. Patient has previous diagnosis of epididymitis on January 10. He has scrotal ultrasound at the time and was given ciprofloxacin for 10 days but no improvement.  Assessment & Plan:   Principal Problem:   Scrotal abscess - Urology on board. They have drained abscess and packed wound. - Continue current antibiotic regimen patient will need 7 days total treatment - narrow antibiotic regimen once fever subsides.   Active Problems:   Sepsis ([place]) - Resolved and secondary to principal problem list above - Follow-up with cultures and blood cultures still negative    COPD (chronic obstructive pulmonary disease) ([facility]) - Stable currently continue albuterol    HTN (hypertension) - Stable on clonidine    Alcohol abuse/Tobacco abuse - Continue nicotine patch - Ativan for withdrawal symptoms   DVT prophylaxis: Lovenox Code Status: Full Family Communication: d/c patient and family at bedside. Disposition Plan: pending improvement in condition   Consultants:   none   Procedures: I and D   Antimicrobials: Vanc and Zosyn   Subjective: Pt feels better and reports no new complaints.   Objective: Vitals:   03/29/17 2200 03/29/17 2259 03/30/17 0539 03/30/17 1347  BP:  124/79 130/81 115/74  Pulse:   77 87  Resp:   18 18  Temp: 99.5 F (37.5 C)  99.5 F (37.5 C) 98.5 F (36.9 C)  TempSrc: Oral  Oral Oral  SpO2:   96% 95%  Weight:      Height:        Intake/Output Summary (Last 24 hours) at 03/30/2017 1704 Last data filed at 03/30/2017 0819 Gross per 24 hour  Intake 4490 ml  Output -  Net 4490 ml   Filed Weights   03/29/17 1152 03/29/17 2000  Weight: 50.3 kg (111 lb) 65.3 kg  (143 lb 15.4 oz)    Examination:  General exam: Appears calm and comfortable, in nad. Respiratory system: Clear to auscultation. Respiratory effort normal. No wheezes Cardiovascular system: S1 & S2 heard, RRR. No JVD, murmurs, rubs, gallops or clicks. No pedal edema. Gastrointestinal system: Abdomen is nondistended, soft and nontender. No organomegaly or masses felt. Normal bowel sounds heard. Central nervous system: Alert and oriented. No focal neurological deficits. Extremities: Symmetric 5 x 5 power. Skin:Large scrotal abscess overlying right hemiscrotum Psychiatry: Judgement and insight appear normal. Mood & affect appropriate.     Data Reviewed: I have personally reviewed following labs and imaging studies  CBC: Recent Labs  Lab 03/29/17 1456 03/30/17 0414  WBC 14.9* 13.7*  NEUTROABS 12.0*  --   HGB 12.4* 10.5*  HCT 36.3* 31.3*  MCV 94.3 94.8  PLT 410* 258*   Basic Metabolic Panel: Recent Labs  Lab 03/29/17 1456 03/30/17 0414  NA 136 137  K 3.1* 3.6  CL 99* 106  CO2 26 26  GLUCOSE 111* 83  BUN 6 8  CREATININE 0.62 0.64  CALCIUM 8.4* 7.9*   GFR: Estimated Creatinine Clearance: 95.2 mL/min (by C-G formula based on SCr of 0.64 mg/dL). Liver Function Tests: Recent Labs  Lab 03/29/17 1456 03/30/17 0414  AST 20 16  ALT 13* 13*  ALKPHOS 77 60  BILITOT 0.3 0.4  PROT 6.8 5.6*  ALBUMIN 3.2* 2.7*  No results for input(s): LIPASE, AMYLASE in the last 168 hours. No results for input(s): AMMONIA in the last 168 hours. Coagulation Profile: No results for input(s): INR, PROTIME in the last 168 hours. Cardiac Enzymes: No results for input(s): CKTOTAL, CKMB, CKMBINDEX, TROPONINI in the last 168 hours. BNP (last 3 results) No results for input(s): PROBNP in the last 8760 hours. HbA1C: Recent Labs    03/29/17 2010  HGBA1C 5.6   CBG: No results for input(s): GLUCAP in the last 168 hours. Lipid Profile: No results for input(s): CHOL, HDL, LDLCALC, TRIG,  CHOLHDL, LDLDIRECT in the last 72 hours. Thyroid Function Tests: No results for input(s): TSH, T4TOTAL, FREET4, T3FREE, THYROIDAB in the last 72 hours. Anemia Panel: No results for input(s): VITAMINB12, FOLATE, FERRITIN, TIBC, IRON, RETICCTPCT in the last 72 hours. Sepsis Labs: Recent Labs  Lab 03/29/17 1456 03/29/17 1818  LATICACIDVEN 2.2* 1.5    Recent Results (from the past 240 hour(s))  Culture, blood (Routine X 2) w Reflex to ID Panel     Status: None (Preliminary result)   Collection Time: 03/29/17  2:56 PM  Result Value Ref Range Status   Specimen Description BLOOD LEFT ANTECUBITAL  Final   Special Requests   Final    BOTTLES DRAWN AEROBIC AND ANAEROBIC Blood Culture adequate volume Performed at [facility] [address], [place], [place] [address]    Culture   Final    NO GROWTH < 24 HOURS Performed at [facility] [address] [place], [place] [address]    Report Status PENDING  Incomplete  Culture, blood (Routine X 2) w Reflex to ID Panel     Status: None (Preliminary result)   Collection Time: 03/29/17  2:58 PM  Result Value Ref Range Status   Specimen Description BLOOD RIGHT ANTECUBITAL  Final   Special Requests   Final    IN PEDIATRIC BOTTLE Blood Culture adequate volume Performed at [facility] [address], [place], [place] [address]    Culture   Final    NO GROWTH < 24 HOURS Performed at [facility] [address], [place], [place] [address]    Report Status PENDING  Incomplete  Urine culture     Status: None   Collection Time: 03/29/17  4:28 PM  Result Value Ref Range Status   Specimen Description   Final    URINE, RANDOM Performed at [facility] [address], [place], [place] [address]    Special Requests   Final    NONE Performed at [facility], [place] [address], [place], [place] [address]    Culture   Final    NO GROWTH Performed at [facility], [place] [address], [place], [place] [address]    Report Status 03/30/2017 FINAL  Final  MRSA PCR Screening     Status: None   Collection Time: 03/30/17  8:30 AM  Result Value Ref Range Status   MRSA by PCR NEGATIVE NEGATIVE Final    Comment:        The GeneXpert MRSA Assay (FDA approved for NASAL specimens only), is one component of a comprehensive MRSA colonization surveillance program. It is not intended to diagnose MRSA infection nor to guide or monitor treatment for MRSA infections. Performed at [facility], [place] [address], [place], [place] [address]          Radiology Studies: US Scrotum W/doppler  Result Date: 03/29/2017  CLINICAL DATA:  Right scrotal pain and enlargement EXAM: SCROTAL ULTRASOUND DOPPLER ULTRASOUND OF THE TESTICLES TECHNIQUE: Complete ultrasound examination of the testicles, epididymis, and other scrotal structures was performed. Color and spectral Doppler ultrasound were also utilized to evaluate blood flow to the testicles. COMPARISON:  None. FINDINGS: Right testicle Right testicle is not clearly visualized by today's exam. Right hemiscrotum is enlarged by a complex septated irregular fluid collection measuring 6.5 x 4.9 x 5.6 cm concerning for development of a right scrotal abscess possibly displacing the right testicle. This is a significant change compared to 02/20/2017. Left testicle Measurements: 3.7 x 2.1 x 6.4 cm. No mass or microlithiasis visualized. Right epididymis:  Not clearly delineated Left epididymis:  Normal in size and appearance. Hydrocele:  None visualized. Varicocele:  None visualized. Pulsed Doppler interrogation of left testicle demonstrates normal low resistance arterial and venous waveforms bilaterally. IMPRESSION: Abnormal right hemi scrotum complex septated irregular fluid collection measuring up to 6.5 cm concerning for right scrotal abscess obscuring and displacing the right testicle and epididymis when  compared to the prior study. These results were called by telephone at the time of interpretation on 03/29/2017 at 4:35 pm to Dr. Vanita Panda, who verbally acknowledged these results. Electronically Signed   By: Jerilynn Mages.  Shick M.D.   On: 03/29/2017 16:37        Scheduled Meds: . cloNIDine  0.1 mg Oral QHS  . docusate sodium  100 mg Oral BID  . enoxaparin (LOVENOX) injection  40 mg Subcutaneous Q24H  . loratadine  10 mg Oral Daily  . montelukast  10 mg Oral Daily  . nicotine  21 mg Transdermal Daily   Continuous Infusions: . sodium chloride 100 mL/hr at 03/29/17 2030  . sodium chloride    . piperacillin-tazobactam (ZOSYN)  IV 3.375 g (03/30/17 1323)  . vancomycin 500 mg (03/30/17 1625)     LOS: 1 day    Time spent: > 35 minutes  Velvet Bathe, MD [facility] Pager [phone]  If 7PM-7AM, please contact night-coverage [email or web address] Password [facility] 03/30/2017, 5:04 PM

## 2017-03-30 NOTE — Plan of Care (Signed)
Patient afebrile this shift, VSS.  Patients pain controlled with oral and IV pain medication this shift.  Fiancee at bedside most of the shift.  Patient very concerned that he is due to be in court at 0800 03/31/17, I informed patient that the social worker can contact the court system for him since he is in the hospital.  This RN did leave a voice mail for social worker that will be covering 03/31/17 and will pass this information on to night nurse as well.

## 2017-03-31 LAB — BLOOD CULTURE ID PANEL (REFLEXED)
ACINETOBACTER BAUMANNII: NOT DETECTED
CANDIDA GLABRATA: NOT DETECTED
CANDIDA KRUSEI: NOT DETECTED
CANDIDA PARAPSILOSIS: NOT DETECTED
Candida albicans: NOT DETECTED
Candida tropicalis: NOT DETECTED
ENTEROBACTER CLOACAE COMPLEX: NOT DETECTED
ENTEROCOCCUS SPECIES: NOT DETECTED
ESCHERICHIA COLI: NOT DETECTED
Enterobacteriaceae species: NOT DETECTED
Haemophilus influenzae: NOT DETECTED
KLEBSIELLA OXYTOCA: NOT DETECTED
Klebsiella pneumoniae: NOT DETECTED
LISTERIA MONOCYTOGENES: NOT DETECTED
Neisseria meningitidis: NOT DETECTED
PROTEUS SPECIES: NOT DETECTED
PSEUDOMONAS AERUGINOSA: NOT DETECTED
SERRATIA MARCESCENS: NOT DETECTED
STREPTOCOCCUS PNEUMONIAE: NOT DETECTED
STREPTOCOCCUS PYOGENES: NOT DETECTED
Staphylococcus aureus (BCID): NOT DETECTED
Staphylococcus species: NOT DETECTED
Streptococcus agalactiae: NOT DETECTED
Streptococcus species: NOT DETECTED

## 2017-03-31 LAB — CREATININE, SERUM
Creatinine, Ser: 0.74 mg/dL (ref 0.61–1.24)
GFR calc Af Amer: >60 mL/min (ref >60)
GFR calc non Af Amer: >60 mL/min (ref >60)

## 2017-03-31 MED ORDER — OXYCODONE HCL 5 MG PO TABS: 5.0000 mg | ORAL_TABLET | Freq: Four times a day (QID) | ORAL | 0 refills | Status: DC | PRN

## 2017-03-31 MED ORDER — SULFAMETHOXAZOLE-TRIMETHOPRIM 800-160 MG PO TABS: 1.0000 | ORAL_TABLET | Freq: Two times a day (BID) | ORAL | 0 refills | Status: AC

## 2017-03-31 MED ORDER — SULFAMETHOXAZOLE-TRIMETHOPRIM 800-160 MG PO TABS
1.0000 | ORAL_TABLET | Freq: Two times a day (BID) | ORAL | Status: DC
  Administered 2017-03-31: 1 via ORAL
  Filled 2017-03-31: qty 1

## 2017-03-31 MED ORDER — MELOXICAM 15 MG PO TABS: 15.0000 mg | ORAL_TABLET | Freq: Every day | ORAL | 0 refills | Status: DC
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
## 2017-03-31 NOTE — Discharge Summary (Addendum)
Physician Discharge Summary  Daniel [name] AOZ:[id number] DOB: Dec 04, 1960 DOA: 03/29/2017  PCP: Marliss Coots, NP  Admit date: 03/29/2017 Discharge date: 03/31/2017  Time spent: > 35 minutes  Recommendations for Outpatient Follow-up:  1. Ensure patient completes 6 more days of Bactrim to complete a total of 8 days of antibiotic therapy. However will aim for 7 day treatment with bactrim 2. Ensure patinet f/u with urologist for further evaluation and recommendations.   Discharge Diagnoses:  Principal Problem:   Scrotal abscess Active Problems:   Sepsis ([place])   COPD (chronic obstructive pulmonary disease) ([place])   HTN (hypertension)   Alcohol abuse   Tobacco abuse   Discharge Condition: stable  Diet recommendation: regular diet  Filed Weights   03/29/17 1152 03/29/17 2000  Weight: 50.3 kg (111 lb) 65.3 kg (143 lb 15.4 oz)    History of present illness:  57 y.o.malewith medical history significant ofhypertension,COPD and alcoholism presenting with swelling of his scrotum which has been going on and off for 2-3 months. Patient has previous diagnosis of epididymitis on January 10. He has scrotal ultrasound at the time and was given ciprofloxacin for 10 days but no improvement.  Hospital Course:  Principal Problem:   Scrotal abscess - Urology on board. They have drained abscess and packed wound. - Continue current antibiotic regimen patient will need 7 days total treatment with bactrim. I will provide script - pain medication regimen per surgeon given recent procedure. Post procedural care per surgery. Please refer to their last note for details. Addendum: surgeon in operation and requested I recommend and write for pain medication on discharge. I will provide enough opiod medication regimen for 1 week until patient is seen by urology as outpatient.  Active Problems:   Sepsis ([place]) - Resolved and secondary to principal problem list above - Pt had 1 out of 3 blood cultures  with gram + rods most likely contaminant. Discussed with pharmacy    COPD (chronic obstructive pulmonary disease) ([place]) - Stable continue home medication regimen.    HTN (hypertension) - Stable on clonidine    Alcohol abuse/Tobacco abuse - recomended cessation  Procedures:  I and D by urology  Consultations:  Urology  Discharge Exam: Vitals:   03/31/17 0503 03/31/17 1230  BP: (!) 146/85 (!) 147/97  Pulse: 82 81  Resp: (!) 22   Temp: 98.5 F (36.9 C) 98.7 F (37.1 C)  SpO2: 98% 99%    General: Pt in nad, alert and awake Cardiovascular: rrr, no rubs Respiratory: no increased wob, no wheezes  Discharge Instructions   Discharge Instructions    Call MD for:  severe uncontrolled pain   Complete by:  As directed    Call MD for:  temperature >100.4   Complete by:  As directed    Diet - low sodium heart healthy   Complete by:  As directed    Discharge instructions   Complete by:  As directed    Please follow up with your Urologist within the next 1 week   Increase activity slowly   Complete by:  As directed      Allergies as of 03/31/2017   No Known Allergies     Medication List    STOP taking these medications   ciprofloxacin 500 MG tablet Commonly known as:  CIPRO   HYDROcodone-acetaminophen 5-325 MG tablet Commonly known as:  NORCO/VICODIN   levofloxacin 500 MG tablet Commonly known as:  LEVAQUIN     TAKE these medications   cloNIDine  0.1 MG tablet Commonly known as:  CATAPRES Take 0.1 mg by mouth at bedtime.   loratadine 10 MG tablet Commonly known as:  CLARITIN Take 10 mg by mouth daily.   meloxicam 15 MG tablet Commonly known as:  MOBIC Take 1 tablet (15 mg total) by mouth daily.   montelukast 10 MG tablet Commonly known as:  SINGULAIR Take 10 mg by mouth daily.   oxyCODONE 5 MG immediate release tablet Commonly known as:  Oxy IR/ROXICODONE Take 1 tablet (5 mg total) by mouth every 6 (six) hours as needed for moderate pain.    sulfamethoxazole-trimethoprim 800-160 MG tablet Commonly known as:  BACTRIM DS,SEPTRA DS Take 1 tablet by mouth every 12 (twelve) hours for 6 days.      No Known Allergies    The results of significant diagnostics from this hospitalization (including imaging, microbiology, ancillary and laboratory) are listed below for reference.    Significant Diagnostic Studies: US Scrotum W/doppler  Result Date: 03/29/2017 CLINICAL DATA:  Right scrotal pain and enlargement EXAM: SCROTAL ULTRASOUND DOPPLER ULTRASOUND OF THE TESTICLES TECHNIQUE: Complete ultrasound examination of the testicles, epididymis, and other scrotal structures was performed. Color and spectral Doppler ultrasound were also utilized to evaluate blood flow to the testicles. COMPARISON:  None. FINDINGS: Right testicle Right testicle is not clearly visualized by today's exam. Right hemiscrotum is enlarged by a complex septated irregular fluid collection measuring 6.5 x 4.9 x 5.6 cm concerning for development of a right scrotal abscess possibly displacing the right testicle. This is a significant change compared to 02/20/2017. Left testicle Measurements: 3.7 x 2.1 x 6.4 cm. No mass or microlithiasis visualized. Right epididymis:  Not clearly delineated Left epididymis:  Normal in size and appearance. Hydrocele:  None visualized. Varicocele:  None visualized. Pulsed Doppler interrogation of left testicle demonstrates normal low resistance arterial and venous waveforms bilaterally. IMPRESSION: Abnormal right hemi scrotum complex septated irregular fluid collection measuring up to 6.5 cm concerning for right scrotal abscess obscuring and displacing the right testicle and epididymis when compared to the prior study. These results were called by telephone at the time of interpretation on 03/29/2017 at 4:35 pm to Dr. Vanita Panda, who verbally acknowledged these results. Electronically Signed   By: Jerilynn Mages.  Shick M.D.   On: 03/29/2017 16:37     Microbiology: Recent Results (from the past 240 hour(s))  Culture, blood (Routine X 2) w Reflex to ID Panel     Status: None (Preliminary result)   Collection Time: 03/29/17  2:56 PM  Result Value Ref Range Status   Specimen Description BLOOD LEFT ANTECUBITAL  Final   Special Requests   Final    BOTTLES DRAWN AEROBIC AND ANAEROBIC Blood Culture adequate volume Performed at [facility] [address], [place], [place] [address]    Culture  Setup Time   Final    GRAM POSITIVE RODS AEROBIC BOTTLE ONLY CRITICAL RESULT CALLED TO, READ BACK BY AND VERIFIED WITH: E JACKSON,PHARMD AT 5621 03/31/17 BY L BENFIELD Performed at [facility], [place] [address], [place], [place] [address]    Culture GRAM POSITIVE RODS  Final   Report Status PENDING  Incomplete  Blood Culture ID Panel (Reflexed)     Status: None   Collection Time: 03/29/17  2:56 PM  Result Value Ref Range Status   Enterococcus species NOT DETECTED NOT DETECTED Final   Listeria monocytogenes NOT DETECTED NOT DETECTED Final   Staphylococcus species NOT DETECTED NOT DETECTED Final   Staphylococcus aureus  NOT DETECTED NOT DETECTED Final   Streptococcus species NOT DETECTED NOT DETECTED Final   Streptococcus agalactiae NOT DETECTED NOT DETECTED Final   Streptococcus pneumoniae NOT DETECTED NOT DETECTED Final   Streptococcus pyogenes NOT DETECTED NOT DETECTED Final   Acinetobacter baumannii NOT DETECTED NOT DETECTED Final   Enterobacteriaceae species NOT DETECTED NOT DETECTED Final   Enterobacter cloacae complex NOT DETECTED NOT DETECTED Final   Escherichia coli NOT DETECTED NOT DETECTED Final   Klebsiella oxytoca NOT DETECTED NOT DETECTED Final   Klebsiella pneumoniae NOT DETECTED NOT DETECTED Final   Proteus species NOT DETECTED NOT DETECTED Final   Serratia marcescens NOT DETECTED NOT DETECTED Final   Haemophilus influenzae NOT DETECTED NOT DETECTED Final   Neisseria meningitidis NOT DETECTED NOT  DETECTED Final   Pseudomonas aeruginosa NOT DETECTED NOT DETECTED Final   Candida albicans NOT DETECTED NOT DETECTED Final   Candida glabrata NOT DETECTED NOT DETECTED Final   Candida krusei NOT DETECTED NOT DETECTED Final   Candida parapsilosis NOT DETECTED NOT DETECTED Final   Candida tropicalis NOT DETECTED NOT DETECTED Final    Comment: Performed at [facility], [place] [address] [place], [place] [address]  Culture, blood (Routine X 2) w Reflex to ID Panel     Status: None (Preliminary result)   Collection Time: 03/29/17  2:58 PM  Result Value Ref Range Status   Specimen Description BLOOD RIGHT ANTECUBITAL  Final   Special Requests   Final    IN PEDIATRIC BOTTLE Blood Culture adequate volume Performed at [facility] [address], [place], [place] [address]    Culture   Final    NO GROWTH < 24 HOURS Performed at [facility] [address] [place], [place] [address]    Report Status PENDING  Incomplete  Urine culture     Status: None   Collection Time: 03/29/17  4:28 PM  Result Value Ref Range Status   Specimen Description   Final    URINE, RANDOM Performed at [facility] [address], [place], [place] [address]    Special Requests   Final    NONE Performed at [facility], [place] [address], [place], [place] [address]    Culture   Final    NO GROWTH Performed at [facility], [place] [address] [place], [place] [address]    Report Status 03/30/2017 FINAL  Final  MRSA PCR Screening     Status: None   Collection Time: 03/30/17  8:30 AM  Result Value Ref Range Status   MRSA by PCR NEGATIVE NEGATIVE Final    Comment:        The GeneXpert MRSA Assay (FDA approved for NASAL specimens only), is one component of a comprehensive MRSA colonization surveillance program. It is not intended to diagnose MRSA infection nor to guide or monitor treatment for MRSA infections. Performed at [facility], [place] [address], [place], [place] [address]      Labs: Basic Metabolic Panel: Recent Labs  Lab 03/29/17 1456 03/30/17 0414 03/31/17 0455  NA 136 137  --   K 3.1* 3.6  --   CL 99* 106  --   CO2 26 26  --   GLUCOSE 111* 83  --   BUN 6 8  --   CREATININE 0.62 0.64 0.74  CALCIUM 8.4* 7.9*  --    Liver Function Tests: Recent Labs  Lab 03/29/17 1456 03/30/17 0414  AST 20 16  ALT 13* 13*  ALKPHOS 77 60  BILITOT 0.3 0.4  PROT 6.8 5.6*  ALBUMIN 3.2* 2.7*   No results for input(s): LIPASE, AMYLASE in the last 168 hours. No results for input(s): AMMONIA in the last 168 hours. CBC: Recent Labs  Lab 03/29/17 1456 03/30/17 0414  WBC 14.9* 13.7*  NEUTROABS 12.0*  --   HGB 12.4* 10.5*  HCT 36.3* 31.3*  MCV 94.3 94.8  PLT 410* 403*   Cardiac Enzymes: No results for input(s): CKTOTAL, CKMB, CKMBINDEX, TROPONINI in the last 168 hours. BNP: BNP (last 3 results) No results for input(s): BNP in the last 8760 hours.  ProBNP (last 3 results) No results for input(s): PROBNP in the last 8760 hours.  CBG: No results for input(s): GLUCAP in the last 168 hours.  Signed:  Velvet Bathe MD.  [facility] 03/31/2017, 1:24 PM

## 2017-03-31 NOTE — Progress Notes (Signed)
Pt asked for help with pain medications. There is NOT a program to assist with pain medications. Pt is able to get his regular medications free from [facility]. He states [facility] give synthetic medications. I explained that there is a co-pay for medications with the exception of pain medications.

## 2017-03-31 NOTE — Progress Notes (Signed)
PHARMACY - PHYSICIAN COMMUNICATION CRITICAL VALUE ALERT - BLOOD CULTURE IDENTIFICATION (BCID)  Daniel [name] is an 57 y.o. male who presented to [facility] on 03/29/2017 with [name] chief complaint of scrotal cellulitis  Assessment: Gram stain and BCID results suggest contamination  Name of physician (or Provider) Contacted: Wendee Beavers  Current antibiotics: Vanc/Zosyn  Changes to prescribed antibiotics recommended: none based on BCID; de-escalating to Bactrim based on clinical improvement  Results for orders placed or performed during the hospital encounter of 03/29/17  Blood Culture ID Panel (Reflexed) (Collected: 03/29/2017  2:56 PM)  Result Value Ref Range   Enterococcus species NOT DETECTED NOT DETECTED   Listeria monocytogenes NOT DETECTED NOT DETECTED   Staphylococcus species NOT DETECTED NOT DETECTED   Staphylococcus aureus NOT DETECTED NOT DETECTED   Streptococcus species NOT DETECTED NOT DETECTED   Streptococcus agalactiae NOT DETECTED NOT DETECTED   Streptococcus pneumoniae NOT DETECTED NOT DETECTED   Streptococcus pyogenes NOT DETECTED NOT DETECTED   Acinetobacter baumannii NOT DETECTED NOT DETECTED   Enterobacteriaceae species NOT DETECTED NOT DETECTED   Enterobacter cloacae complex NOT DETECTED NOT DETECTED   Escherichia coli NOT DETECTED NOT DETECTED   Klebsiella oxytoca NOT DETECTED NOT DETECTED   Klebsiella pneumoniae NOT DETECTED NOT DETECTED   Proteus species NOT DETECTED NOT DETECTED   Serratia marcescens NOT DETECTED NOT DETECTED   Haemophilus influenzae NOT DETECTED NOT DETECTED   Neisseria meningitidis NOT DETECTED NOT DETECTED   Pseudomonas aeruginosa NOT DETECTED NOT DETECTED   Candida albicans NOT DETECTED NOT DETECTED   Candida glabrata NOT DETECTED NOT DETECTED   Candida krusei NOT DETECTED NOT DETECTED   Candida parapsilosis NOT DETECTED NOT DETECTED   Candida tropicalis NOT DETECTED NOT DETECTED    Daniel [name] 03/31/2017  10:11 AM

## 2017-04-03 LAB — CULTURE, BLOOD (ROUTINE X 2)
CULTURE: NO GROWTH
SPECIAL REQUESTS: ADEQUATE
Special Requests: ADEQUATE

## 2017-05-31 ENCOUNTER — Encounter (HOSPITAL_COMMUNITY): Payer: Self-pay

## 2017-05-31 ENCOUNTER — Other Ambulatory Visit: Payer: Self-pay

## 2017-05-31 ENCOUNTER — Emergency Department (HOSPITAL_COMMUNITY)
Admission: EM | Admit: 2017-05-31 | Discharge: 2017-05-31 | Disposition: A | Discharge status: Home / Self Care | Payer: Self-pay | Attending: Emergency Medicine | Admitting: Emergency Medicine

## 2017-05-31 DIAGNOSIS — Y939 Activity, unspecified: Secondary | ICD-10-CM | POA: Insufficient documentation

## 2017-05-31 DIAGNOSIS — Y998 Other external cause status: Secondary | ICD-10-CM | POA: Insufficient documentation

## 2017-05-31 DIAGNOSIS — M549 Dorsalgia, unspecified: Secondary | ICD-10-CM | POA: Insufficient documentation

## 2017-05-31 DIAGNOSIS — R51 Headache: Secondary | ICD-10-CM | POA: Insufficient documentation

## 2017-05-31 DIAGNOSIS — Y92410 Unspecified street and highway as the place of occurrence of the external cause: Secondary | ICD-10-CM | POA: Insufficient documentation

## 2017-05-31 DIAGNOSIS — M542 Cervicalgia: Secondary | ICD-10-CM | POA: Insufficient documentation

## 2017-05-31 NOTE — ED Notes (Signed)
Patient has been sleeping soundly, NAD.
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
## 2017-05-31 NOTE — ED Notes (Signed)
Pt not in room. Pt was seen earlier leaving through the double door to the lobby. He was told if he went out of the department he would be removed from the system to please stay in room. Due to this he will be removed from system.

## 2018-12-25 ENCOUNTER — Emergency Department (HOSPITAL_COMMUNITY): Payer: Self-pay

## 2018-12-25 ENCOUNTER — Other Ambulatory Visit: Payer: Self-pay

## 2018-12-25 ENCOUNTER — Emergency Department (HOSPITAL_COMMUNITY)
Admission: EM | Admit: 2018-12-25 | Discharge: 2018-12-26 | Disposition: A | Discharge status: Home / Self Care | Payer: Self-pay | Attending: Emergency Medicine | Admitting: Emergency Medicine

## 2018-12-25 ENCOUNTER — Encounter (HOSPITAL_COMMUNITY): Payer: Self-pay | Admitting: Emergency Medicine

## 2018-12-25 DIAGNOSIS — Z79899 Other long term (current) drug therapy: Secondary | ICD-10-CM | POA: Insufficient documentation

## 2018-12-25 DIAGNOSIS — I10 Essential (primary) hypertension: Secondary | ICD-10-CM | POA: Insufficient documentation

## 2018-12-25 DIAGNOSIS — K4091 Unilateral inguinal hernia, without obstruction or gangrene, recurrent: Secondary | ICD-10-CM | POA: Insufficient documentation

## 2018-12-25 DIAGNOSIS — Z760 Encounter for issue of repeat prescription: Secondary | ICD-10-CM | POA: Insufficient documentation

## 2018-12-25 DIAGNOSIS — F17210 Nicotine dependence, cigarettes, uncomplicated: Secondary | ICD-10-CM | POA: Insufficient documentation

## 2018-12-25 DIAGNOSIS — J449 Chronic obstructive pulmonary disease, unspecified: Secondary | ICD-10-CM | POA: Insufficient documentation

## 2018-12-25 DIAGNOSIS — J45909 Unspecified asthma, uncomplicated: Secondary | ICD-10-CM | POA: Insufficient documentation

## 2018-12-25 HISTORY — DX: Unspecified asthma, uncomplicated: J45.909

## 2018-12-25 LAB — COMPREHENSIVE METABOLIC PANEL
ALT: 13 U/L (ref 0–44)
AST: 16 U/L (ref 15–41)
Albumin: 4.4 g/dL (ref 3.5–5.0)
Alkaline Phosphatase: 65 U/L (ref 38–126)
Anion gap: 12 (ref 5–15)
BUN: 11 mg/dL (ref 6–20)
CO2: 21 mmol/L — ABNORMAL LOW (ref 22–32)
Calcium: 8.7 mg/dL — ABNORMAL LOW (ref 8.9–10.3)
Chloride: 105 mmol/L (ref 98–111)
Creatinine, Ser: 1.05 mg/dL (ref 0.61–1.24)
GFR calc Af Amer: >60 mL/min (ref >60)
GFR calc non Af Amer: >60 mL/min (ref >60)
Glucose, Bld: 86 mg/dL (ref 70–99)
Potassium: 3.9 mmol/L (ref 3.5–5.1)
Sodium: 138 mmol/L (ref 135–145)
Total Bilirubin: 0.4 mg/dL (ref 0.3–1.2)
Total Protein: 7.5 g/dL (ref 6.5–8.1)

## 2018-12-25 LAB — URINALYSIS, ROUTINE W REFLEX MICROSCOPIC
Bilirubin Urine: NEGATIVE
Glucose, UA: NEGATIVE mg/dL
Hgb urine dipstick: NEGATIVE
Ketones, ur: NEGATIVE mg/dL
Leukocytes,Ua: NEGATIVE
Nitrite: NEGATIVE
Protein, ur: NEGATIVE mg/dL
Specific Gravity, Urine: 1.001 — ABNORMAL LOW (ref 1.005–1.030)
pH: 6 (ref 5.0–8.0)

## 2018-12-25 LAB — CBC
HCT: 46.7 % (ref 39.0–52.0)
Hemoglobin: 15.1 g/dL (ref 13.0–17.0)
MCH: 31.3 pg (ref 26.0–34.0)
MCHC: 32.3 g/dL (ref 30.0–36.0)
MCV: 96.9 fL (ref 80.0–100.0)
Platelets: 331 10*3/uL (ref 150–400)
RBC: 4.82 MIL/uL (ref 4.22–5.81)
RDW: 13.3 % (ref 11.5–15.5)
WBC: 7.1 10*3/uL (ref 4.0–10.5)
nRBC: 0 % (ref 0.0–0.2)

## 2018-12-25 LAB — LIPASE, BLOOD: Lipase: 28 U/L (ref 11–51)

## 2018-12-25 MED ORDER — ONDANSETRON HCL 4 MG/2ML IJ SOLN
4.0000 mg | Freq: Once | INTRAMUSCULAR | Status: AC
  Administered 2018-12-25: 4 mg via INTRAVENOUS
  Filled 2018-12-25: qty 2

## 2018-12-25 MED ORDER — IOHEXOL 300 MG/ML  SOLN
100.0000 mL | Freq: Once | INTRAMUSCULAR | Status: AC | PRN
  Administered 2018-12-25: 100 mL via INTRAVENOUS

## 2018-12-25 MED ORDER — IOHEXOL 9 MG/ML PO SOLN
ORAL | Status: AC
  Filled 2018-12-25: qty 1000

## 2018-12-25 MED ORDER — MORPHINE SULFATE (PF) 4 MG/ML IV SOLN
4.0000 mg | Freq: Once | INTRAVENOUS | Status: AC
  Administered 2018-12-25: 22:00:00 4 mg via INTRAVENOUS
  Filled 2018-12-25: qty 1

## 2018-12-25 NOTE — ED Triage Notes (Signed)
Pt c/o abdomen pain that started in his "groin that has worked its way up" x 5 days. Last BM was yesterday.

## 2018-12-25 NOTE — ED Provider Notes (Signed)
[facility] EMERGENCY DEPARTMENT Provider Note   CSN: [id number] Arrival date & time: 12/25/18  1814     History   Chief Complaint Chief Complaint  Patient presents with  . Abdominal Pain    HPI TAIWAN MADDOCKS is a 58 y.o. male with a history of COPD, HTN and etoh abuse presenting with a 5-day history of slowly worsening abdominal pain and distention.  His pain has become constant and sharp and worse with movement and certain positions.  The pain started low in his abdomen and now encompasses his entire upper abdomen as well.  Denies back pain.  He endorses abdominal distention, nausea with emesis and has been unable to tolerate any p.o. intake for the past 2 days.  He denies diarrhea or constipation, his last bowel movement was yesterday, he is passing flatus.   He denies dysuria or hematuria.  He has a known right inguinal hernia and was admitted last year with a scrotal abscess.  He denies fevers or chills.  He does endorse shortness of breath but he states this is a chronic condition and not worsened with his current complaints.  He has had no medications prior to arrival for symptoms.  As a side note,  he reports that he has run out of all of his blood pressure medications yesterday.     The history is provided by the patient.    Past Medical History:  Diagnosis Date  . Arthritis   . Asthma   . COPD (chronic obstructive pulmonary disease) ([place])   . Hypertension     Patient Active Problem List   Diagnosis Date Noted  . Sepsis ([place]) 03/29/2017  . Scrotal abscess 03/29/2017  . COPD (chronic obstructive pulmonary disease) ([place]) 03/29/2017  . HTN (hypertension) 03/29/2017  . Alcohol abuse 03/29/2017  . Tobacco abuse 03/29/2017  . Fracture, thyroid cartilage closed ([place]) 11/16/2014    Past Surgical History:  Procedure Laterality Date  . HAND SURGERY Right         Home Medications    Prior to Admission medications   Medication Sig Start Date End Date Taking?  Authorizing Provider  cloNIDine (CATAPRES) 0.1 MG tablet Take 0.1 mg by mouth at bedtime.    Yes [provider]  loratadine (CLARITIN) 10 MG tablet Take 10 mg by mouth daily.   Yes [provider]  meloxicam (MOBIC) 15 MG tablet Take 1 tablet (15 mg total) by mouth daily. 03/31/17  Yes Velvet Bathe, MD  montelukast (SINGULAIR) 10 MG tablet Take 10 mg by mouth daily.   Yes [provider]  cloNIDine (CATAPRES) 0.1 MG tablet Take 1 tablet (0.1 mg total) by mouth daily. 12/26/18   Evalee Jefferson, PA-C  docusate sodium (COLACE) 100 MG capsule Take 1 capsule (100 mg total) by mouth every 12 (twelve) hours. 12/26/18   Evalee Jefferson, PA-C  loratadine (CLARITIN) 10 MG tablet Take 1 tablet (10 mg total) by mouth daily. 12/26/18   Evalee Jefferson, PA-C  Meloxicam 15 MG TBDP Take 15 mg by mouth daily. 12/26/18   Evalee Jefferson, PA-C  metoprolol tartrate (LOPRESSOR) 25 MG tablet Take 0.5 tablets (12.5 mg total) by mouth 2 (two) times daily. 12/26/18   Evalee Jefferson, PA-C    Family History No family history on file.  Social History Social History   Tobacco Use  . Smoking status: Current Every Day Smoker    Packs/day: 1.00    Types: Cigarettes  . Smokeless tobacco: Never Used  Substance Use Topics  .  Alcohol use: Yes    Comment: daily  . Drug use: No     Allergies   Patient has no known allergies.   Review of Systems Review of Systems  Constitutional: Negative for chills and fever.  HENT: Negative for congestion and sore throat.   Eyes: Negative.   Respiratory: Negative for chest tightness and shortness of breath.   Cardiovascular: Negative for chest pain.  Gastrointestinal: Positive for abdominal distention, abdominal pain, nausea and vomiting.  Genitourinary: Negative.   Musculoskeletal: Negative for arthralgias, joint swelling and neck pain.  Skin: Negative.  Negative for rash and wound.  Neurological: Negative for dizziness, weakness, light-headedness, numbness and  headaches.  Psychiatric/Behavioral: Negative.      Physical Exam Updated Vital Signs BP (!) 137/96   Pulse 90   Temp 97.9 F (36.6 C) (Oral)   Resp 20   Ht 5\' 7"  (1.702 m)   Wt 77.1 kg   SpO2 95%   BMI 26.63 kg/m   Physical Exam Vitals signs and nursing note reviewed.  Constitutional:      Appearance: He is well-developed.  HENT:     Head: Normocephalic and atraumatic.  Eyes:     Conjunctiva/sclera: Conjunctivae normal.  Neck:     Musculoskeletal: Normal range of motion.  Cardiovascular:     Rate and Rhythm: Normal rate and regular rhythm.     Heart sounds: Normal heart sounds.  Pulmonary:     Effort: Pulmonary effort is normal.     Breath sounds: Normal breath sounds. No wheezing.  Abdominal:     General: Bowel sounds are normal. There is distension.     Palpations: Abdomen is soft.     Tenderness: There is generalized abdominal tenderness. There is no guarding or rebound.     Hernia: A hernia is present. Hernia is present in the right inguinal area.     Comments: Patient with significant abdominal distention with increased tympany to percussion.  No guarding.  Soft edema right inguinal region.  Bowel sounds present. Mildly tender.   Musculoskeletal: Normal range of motion.  Skin:    General: Skin is warm and dry.  Neurological:     Mental Status: He is alert.      ED Treatments / Results  Labs (all labs ordered are listed, but only abnormal results are displayed) Labs Reviewed  COMPREHENSIVE METABOLIC PANEL - Abnormal; Notable for the following components:      Result Value   CO2 21 (*)    Calcium 8.7 (*)    All other components within normal limits  URINALYSIS, ROUTINE W REFLEX MICROSCOPIC - Abnormal; Notable for the following components:   Color, Urine COLORLESS (*)    Specific Gravity, Urine 1.001 (*)    All other components within normal limits  LIPASE, BLOOD  CBC    EKG None  Radiology Ct Abdomen Pelvis W Contrast  Result Date: 12/26/2018  CLINICAL DATA:  Acute abdominal pain EXAM: CT ABDOMEN AND PELVIS WITH CONTRAST TECHNIQUE: Multidetector CT imaging of the abdomen and pelvis was performed using the standard protocol following bolus administration of intravenous contrast. CONTRAST:  130mL OMNIPAQUE IOHEXOL 300 MG/ML  SOLN COMPARISON:  None. FINDINGS: Lower chest: No acute abnormality. Hepatobiliary: No focal liver abnormality is seen. No gallstones, gallbladder wall thickening, or biliary dilatation. Pancreas: Unremarkable. No pancreatic ductal dilatation or surrounding inflammatory changes. Spleen: Normal in size without focal abnormality. Adrenals/Urinary Tract: Adrenal glands are within normal limits. Kidneys are well visualized bilaterally. No obstructive changes are seen.  No renal calculi are noted. Bladder is partially distended. Stomach/Bowel: Colon is within normal limits. The appendix is unremarkable. Herniation of distal ileal loop is noted into a right inguinal hernia. No obstructive changes are noted at this time. Stomach is within normal limits. Vascular/Lymphatic: Aortic atherosclerosis. No enlarged abdominal or pelvic lymph nodes. Reproductive: Prostate is unremarkable. Other: No abdominal wall hernia or abnormality. No abdominopelvic ascites. Musculoskeletal: Mild degenerative changes of lumbar spine are noted. IMPRESSION: Right inguinal hernia with loops of distal ileum within. No definitive incarceration is noted. No obstructive changes are seen. No other focal abnormality is noted. Electronically Signed   By: Inez Catalina M.D.   On: 12/26/2018 00:10    Procedures Procedures (including critical care time)  Medications Ordered in ED Medications  iohexol (OMNIPAQUE) 9 MG/ML oral solution (has no administration in time range)  morphine 4 MG/ML injection 4 mg (4 mg Intravenous Given 12/25/18 2226)  ondansetron (ZOFRAN) injection 4 mg (4 mg Intravenous Given 12/25/18 2226)  iohexol (OMNIPAQUE) 300 MG/ML solution 100 mL (100  mLs Intravenous Contrast Given 12/25/18 2347)     Initial Impression / Assessment and Plan / ED Course  I have reviewed the triage vital signs and the nursing notes.  Pertinent labs & imaging results that were available during my care of the patient were reviewed by me and considered in my medical decision making (see chart for details).        Pt's labs and Ct imaging reviewed and discussed with pt.  abd is soft at re-examination, distended.  Right inguinal hernia soft, positive bowel sounds, does not completely reduce but pt is comfortable.  Discussed need for surgical evaluation as outpt - referred to Dr. Arnoldo Morale.  He was given refills on his home medicines.  Case management referral for resource assistance. Also given info about [facility] pt assistance program.      Final Clinical Impressions(s) / ED Diagnoses   Final diagnoses:  Unilateral recurrent inguinal hernia without obstruction or gangrene  Medication refill    ED Discharge Orders         Ordered    loratadine (CLARITIN) 10 MG tablet  Daily     12/26/18 0140    Meloxicam 15 MG TBDP  Daily     12/26/18 0140    cloNIDine (CATAPRES) 0.1 MG tablet  Daily     12/26/18 0140    metoprolol tartrate (LOPRESSOR) 25 MG tablet  2 times daily     12/26/18 0140    docusate sodium (COLACE) 100 MG capsule  Every 12 hours     12/26/18 0143           Evalee Jefferson, PA-C 12/26/18 [id number]    Mesner, Corene Cornea, MD 12/26/18 [phone]

## 2018-12-26 MED ORDER — LORATADINE 10 MG PO TABS: 10.0000 mg | ORAL_TABLET | Freq: Every day | ORAL | 0 refills | Status: DC

## 2018-12-26 MED ORDER — CLONIDINE HCL 0.1 MG PO TABS: 0.1000 mg | ORAL_TABLET | Freq: Every day | ORAL | 11 refills | Status: DC

## 2018-12-26 MED ORDER — METOPROLOL TARTRATE 25 MG PO TABS: 12.5000 mg | ORAL_TABLET | Freq: Two times a day (BID) | ORAL | 0 refills | Status: DC

## 2018-12-26 MED ORDER — DOCUSATE SODIUM 100 MG PO CAPS: 100.0000 mg | ORAL_CAPSULE | Freq: Two times a day (BID) | ORAL | 0 refills | Status: DC

## 2018-12-26 MED ORDER — ALBUTEROL SULFATE HFA 108 (90 BASE) MCG/ACT IN AERS
2.0000 | INHALATION_SPRAY | RESPIRATORY_TRACT | Status: DC | PRN
  Filled 2018-12-26: qty 6.7

## 2018-12-26 MED ORDER — MELOXICAM 15 MG PO TBDP: 15.0000 mg | ORAL_TABLET | Freq: Every day | ORAL | 0 refills | Status: DC

## 2018-12-26 NOTE — ED Provider Notes (Signed)
Medical screening examination/treatment/procedure(s) were conducted as a shared visit with non-physician practitioner(s) and myself.  I personally evaluated the patient during the encounter.  Patient presents to the emergency department today with a right-sided inguinal hernia.  Pay states is been there for years but it seems to be getting worse lately with worsening pain.  He is also had some change in his bowel movements.  Patient states he still passing gas but it feels like he is not have his bowel movements as regularly as before.  He also states that over the last few days his abdomen will intermittently become more distended.  No trouble with urination.  No nausea or vomiting.  No fevers. My examination patient has a moderate sized right direct inguinal hernia.  It does not seem to be tender to touch.  I pushed quite a bit while he is speaking and it does not interrupt him.  His abdomen does seem to be slightly distended but is not peritoneal take or diffusely tender on my exam.  I do think he has some discomfort with the distention however.  He has bowel sounds. CT and other work-up as above without any evidence of obstruction.  Patient is still having flatus and bowel movements without nausea or vomiting so clinically does not seem to be obstructed either.  Low suspicion for incarcerated hernia at this time with a normal white count no erythema or significant tenderness. I do think this hernia probably needs to be repaired urgently but not emergently.  There is no indication for admission to hospital or further work-up in the ER at this time.  He has a high risk of this becoming incarcerated and causing a bowel obstruction thus I stressed the importance to him of getting appropriate follow-up for surgical consultation as soon as possible.  Also discussed with him twice the signs and symptoms that necessitate immediate return to the emergency department.  Stable for discharge at this time.         Morgin Halls, Corene Cornea, MD 12/26/18 [phone]

## 2018-12-26 NOTE — Discharge Instructions (Addendum)
Your lab tests and CT scan are ok tonight - there is no need for emergent surgery to have your hernia repaired but you need to have this closely followed up.  Call Dr. Arnoldo Morale on Monday to arrange an office visit.  We are also going to ask our case manager from the hospital contact you to see if there are other resources available to you for getting the medical care you need.  Return here if you develop any worsened pain, fevers or vomiting.  Take the colace prescribed to help keep your stools soft.     [facility]  [address] [place], [place] [address] [phone]  Services [facility] offers a variety of basic health services.  Services include but are not limited to: Blood pressure checks  Heart rate checks  Blood sugar checks  Urine analysis  Rapid strep tests  Pregnancy tests.  Health education and referrals  People needing more complex services will be directed to a physician online. Using these virtual visits, doctors can evaluate and prescribe medicine and treatments. There will be no medication on-site, though [facility] will help patients fill their prescriptions at little to no cost.   For More information please go to: [email or web address]

## 2018-12-28 ENCOUNTER — Other Ambulatory Visit: Payer: Self-pay

## 2018-12-28 ENCOUNTER — Encounter (HOSPITAL_COMMUNITY): Payer: Self-pay | Admitting: Emergency Medicine

## 2018-12-28 ENCOUNTER — Emergency Department (HOSPITAL_COMMUNITY): Payer: Self-pay

## 2018-12-28 ENCOUNTER — Emergency Department (HOSPITAL_COMMUNITY)
Admission: EM | Admit: 2018-12-28 | Discharge: 2018-12-28 | Disposition: A | Discharge status: Home / Self Care | Payer: Self-pay | Attending: Emergency Medicine | Admitting: Emergency Medicine

## 2018-12-28 DIAGNOSIS — I10 Essential (primary) hypertension: Secondary | ICD-10-CM | POA: Insufficient documentation

## 2018-12-28 DIAGNOSIS — Z79899 Other long term (current) drug therapy: Secondary | ICD-10-CM | POA: Insufficient documentation

## 2018-12-28 DIAGNOSIS — K4090 Unilateral inguinal hernia, without obstruction or gangrene, not specified as recurrent: Secondary | ICD-10-CM | POA: Insufficient documentation

## 2018-12-28 DIAGNOSIS — F17210 Nicotine dependence, cigarettes, uncomplicated: Secondary | ICD-10-CM | POA: Insufficient documentation

## 2018-12-28 DIAGNOSIS — J449 Chronic obstructive pulmonary disease, unspecified: Secondary | ICD-10-CM | POA: Insufficient documentation

## 2018-12-28 HISTORY — DX: Gastro-esophageal reflux disease without esophagitis: K21.9

## 2018-12-28 MED ORDER — HYDROCODONE-ACETAMINOPHEN 5-325 MG PO TABS
1.0000 | ORAL_TABLET | Freq: Once | ORAL | Status: AC
  Administered 2018-12-28: 1 via ORAL
  Filled 2018-12-28: qty 1

## 2018-12-28 MED ORDER — ONDANSETRON 8 MG PO TBDP
8.0000 mg | ORAL_TABLET | Freq: Once | ORAL | Status: AC
  Administered 2018-12-28: 8 mg via ORAL
  Filled 2018-12-28: qty 1

## 2018-12-28 NOTE — ED Triage Notes (Signed)
Patient complaining of abdominal pain that started in his groin. Pain has been on going x 2 weeks. Patient seen here on the 13 th for the same.

## 2018-12-28 NOTE — ED Notes (Signed)
Meal tray ordered for patient. Per Dr. Constance Haw, pt can be d/c if he tolerates eating and drinking.

## 2018-12-28 NOTE — ED Provider Notes (Signed)
At change of shift care was signed out pending surgical evaluation.  Dr. Constance Haw has seen the patient in person, he has having bowel movements, taking p.o., has a follow-up tomorrow with the surgeon, he does not have an imminently surgical condition.  Stable for discharge   Noemi Chapel, MD 12/28/18 [phone]

## 2018-12-28 NOTE — ED Notes (Signed)
Pt tolerating food and liquids with no complaints.

## 2018-12-28 NOTE — ED Provider Notes (Addendum)
[facility] EMERGENCY DEPARTMENT Provider Note   CSN: [id number] Arrival date & time: 12/28/18  0553   Time seen 6:02 AM  History   Chief Complaint Chief Complaint  Patient presents with  . Abdominal Pain    hernia    HPI Daniel [name] is a 58 y.o. male.     HPI patient states about 10 days ago he started having pain from a right inguinal hernia.  He was seen in the ED on November 13 and had a CT scan which showed he had bowel in his right inguinal hernia but no obstruction.  He states it got worse either Friday the 13th or Saturday the 14th.  He states he has had nausea and vomited twice this morning.  He states he had a small bowel movement but otherwise no bowel movement in 24 hours.  He states his hernia has been hard since Saturday, of the 14th.  He denies any fever.  He has not made a follow-up appointment yet with the surgeon.  PCP Placey, Audrea Muscat, NP   Past Medical History:  Diagnosis Date  . Arthritis   . Asthma   . COPD (chronic obstructive pulmonary disease) ([place])   . GERD (gastroesophageal reflux disease)   . Hypertension     Patient Active Problem List   Diagnosis Date Noted  . Sepsis ([place]) 03/29/2017  . Scrotal abscess 03/29/2017  . COPD (chronic obstructive pulmonary disease) ([place]) 03/29/2017  . HTN (hypertension) 03/29/2017  . Alcohol abuse 03/29/2017  . Tobacco abuse 03/29/2017  . Fracture, thyroid cartilage closed ([place]) 11/16/2014    Past Surgical History:  Procedure Laterality Date  . HAND SURGERY Right         Home Medications    Prior to Admission medications   Medication Sig Start Date End Date Taking? Authorizing Provider  cloNIDine (CATAPRES) 0.1 MG tablet Take 0.1 mg by mouth at bedtime.     [provider]  cloNIDine (CATAPRES) 0.1 MG tablet Take 1 tablet (0.1 mg total) by mouth daily. 12/26/18   Evalee Jefferson, PA-C  docusate sodium (COLACE) 100 MG capsule Take 1 capsule (100 mg total) by mouth every 12 (twelve) hours.  12/26/18   Evalee Jefferson, PA-C  loratadine (CLARITIN) 10 MG tablet Take 10 mg by mouth daily.    [provider]  loratadine (CLARITIN) 10 MG tablet Take 1 tablet (10 mg total) by mouth daily. 12/26/18   Evalee Jefferson, PA-C  meloxicam (MOBIC) 15 MG tablet Take 1 tablet (15 mg total) by mouth daily. 03/31/17   Velvet Bathe, MD  Meloxicam 15 MG TBDP Take 15 mg by mouth daily. 12/26/18   Evalee Jefferson, PA-C  metoprolol tartrate (LOPRESSOR) 25 MG tablet Take 0.5 tablets (12.5 mg total) by mouth 2 (two) times daily. 12/26/18   Evalee Jefferson, PA-C  montelukast (SINGULAIR) 10 MG tablet Take 10 mg by mouth daily.    [provider]    Family History History reviewed. No pertinent family history.  Social History Social History   Tobacco Use  . Smoking status: Current Every Day Smoker    Packs/day: 1.00    Types: Cigarettes  . Smokeless tobacco: Never Used  Substance Use Topics  . Alcohol use: Yes    Comment: daily  . Drug use: No     Allergies   Patient has no known allergies.   Review of Systems Review of Systems  All other systems reviewed and are negative.    Physical Exam Updated Vital  Signs BP (!) 183/102 (BP Location: Left Arm)   Pulse 90   Temp 98.2 F (36.8 C) (Oral)   Resp 18   Ht 5\' 7"  (1.702 m)   Wt 81.6 kg   SpO2 98%   BMI 28.19 kg/m   Physical Exam Vitals signs and nursing note reviewed.  Constitutional:      General: He is in acute distress.     Appearance: Normal appearance.  HENT:     Head: Normocephalic and atraumatic.     Nose: Nose normal.  Eyes:     Extraocular Movements: Extraocular movements intact.     Conjunctiva/sclera: Conjunctivae normal.  Neck:     Musculoskeletal: Normal range of motion.  Cardiovascular:     Rate and Rhythm: Normal rate.  Pulmonary:     Effort: Pulmonary effort is normal. No respiratory distress.  Abdominal:     Tenderness: There is generalized abdominal tenderness.     Hernia: A hernia is present.  Hernia is present in the right inguinal area.  Musculoskeletal: Normal range of motion.  Skin:    General: Skin is warm and dry.     Findings: No erythema.  Neurological:     General: No focal deficit present.     Mental Status: He is alert and oriented to person, place, and time.     Cranial Nerves: No cranial nerve deficit.  Psychiatric:        Mood and Affect: Mood normal.        Behavior: Behavior normal.        Thought Content: Thought content normal.      ED Treatments / Results  Labs (all labs ordered are listed, but only abnormal results are displayed)   CBC    Component Value Date/Time   WBC 7.1 12/25/2018 2011   RBC 4.82 12/25/2018 2011   HGB 15.1 12/25/2018 2011   HCT 46.7 12/25/2018 2011   PLT 331 12/25/2018 2011   MCV 96.9 12/25/2018 2011   MCH 31.3 12/25/2018 2011   MCHC 32.3 12/25/2018 2011   RDW 13.3 12/25/2018 2011   LYMPHSABS 1.4 03/29/2017 1456   MONOABS 1.4 (H) 03/29/2017 1456   EOSABS 0.1 03/29/2017 1456   BASOSABS 0.0 03/29/2017 1456   CMP Latest Ref Rng & Units 12/25/2018 03/31/2017 03/30/2017  Glucose 70 - 99 mg/dL 86 - 83  BUN 6 - 20 mg/dL 11 - 8  Creatinine 0.61 - 1.24 mg/dL 1.05 0.74 0.64  Sodium 135 - 145 mmol/L 138 - 137  Potassium 3.5 - 5.1 mmol/L 3.9 - 3.6  Chloride 98 - 111 mmol/L 105 - 106  CO2 22 - 32 mmol/L 21(L) - 26  Calcium 8.9 - 10.3 mg/dL 8.7(L) - 7.9(L)  Total Protein 6.5 - 8.1 g/dL 7.5 - 5.6(L)  Total Bilirubin 0.3 - 1.2 mg/dL 0.4 - 0.4  Alkaline Phos 38 - 126 U/L 65 - 60  AST 15 - 41 U/L 16 - 16  ALT 0 - 44 U/L 13 - 13(L)     EKG None  Radiology Dg Abd Portable 2 Views  Result Date: 12/28/2018 CLINICAL DATA:  Status post reduction of right inguinal hernia. EXAM: PORTABLE ABDOMEN - 2 VIEW COMPARISON:  CT 12/25/2018, 3 days prior FINDINGS: No definite bowel gas visualized in the right inguinal region. Dilated small bowel in the central abdomen measuring up to 3.3 cm with air-fluid levels. Mild gaseous gastric  distension. No free air. No abnormal soft tissue calcifications or radiopaque calculi. No osseous abnormalities are  seen. Included lung bases are clear. IMPRESSION: Dilated small bowel in the central abdomen with air-fluid levels, consistent with small bowel obstruction. No free air. No definite bowel gas in the right inguinal region corresponding to bowel containing hernia on prior CT. Electronically Signed   By: Keith Rake M.D.   On: 12/28/2018 06:48    Procedures Procedures (including critical care time)  Medications Ordered in ED Medications  HYDROcodone-acetaminophen (NORCO/VICODIN) 5-325 MG per tablet 1 tablet (1 tablet Oral Given 12/28/18 0622)  ondansetron (ZOFRAN-ODT) disintegrating tablet 8 mg (8 mg Oral Given 12/28/18 [id number])     Initial Impression / Assessment and Plan / ED Course  I have reviewed the triage vital signs and the nursing notes.  Pertinent labs & imaging results that were available during my care of the patient were reviewed by me and considered in my medical decision making (see chart for details).       Patient was placed in Trendelenburg, he has a small right inguinal hernia that fits in the palm of my hand.  I placed constant pressure on it and it easily reduced.  Patient states "it's going to come back out again when I cough".  Two-view x-ray was done to see if he had signs of bowel obstruction.  Janett Billow, RN was chaperone for procedure.  After reviewing his x-ray surgery consult was ordered.  7:05 AM Dr. Constance Haw, surgery on call will see patient in the ED.  Patient informed that surgery was go evaluate him this morning.  He states that hernia has not come back out and he is actually holding pressure in that area to keep it from coming out again.  He states "tell her to cut it out today".  He was advised that the surgeon would examine him and decide if it needed to have surgery today or if he could wait as an outpatient.  Final Clinical Impressions(s) /  ED Diagnoses   Final diagnoses:  Reducible right inguinal hernia    Disposition pending Dr Constance Haw evaluation  Rolland Porter, MD, [place] Small, Daleen Bo, MD 12/28/18 [place], [place], [place] 12/28/18 [phone]

## 2018-12-28 NOTE — Consult Note (Signed)
[facility] Consult  Reason for Consult: Right inguinal hernia  Referring Physician:  Dr. Tomi Bamberger  Chief Complaint    Abdominal Pain      HPI: Daniel [name] is a 58 y.o. male with a right inguinal hernia that was painful and stuck out since Friday. He reported to have come to the ED on Friday, and it is unclear if the hernia was reduced or not based on the notes. He was told to come back with worsening pain. He was given information for follow up with surgery.  He returned to the hospital today with worsening pain and nausea/vomiting.  Dr. Tomi Bamberger was able to reduce the hernia in the ED with Trendelenburg positioning, and a follow up KUB done demonstrated some dilated bowel consistent with obstruction. This was obtained just after his reduction. He has since had multiple BMs and is hungry.  He is having no pain in the abdomen or in the hernia site.    Past Medical History:  Diagnosis Date  . Arthritis   . Asthma   . COPD (chronic obstructive pulmonary disease) ([place])   . GERD (gastroesophageal reflux disease)   . Hypertension     Past Surgical History:  Procedure Laterality Date  . HAND SURGERY Right     History reviewed. No pertinent family history.  Social History   Tobacco Use  . Smoking status: Current Every Day Smoker    Packs/day: 1.00    Types: Cigarettes  . Smokeless tobacco: Never Used  Substance Use Topics  . Alcohol use: Yes    Comment: daily  . Drug use: No    Medications: I have reviewed the patient's current medications. No current facility-administered medications for this encounter.    Current Outpatient Medications  Medication Sig Dispense Refill Last Dose  . cloNIDine (CATAPRES) 0.1 MG tablet Take 0.1 mg by mouth at bedtime.      . cloNIDine (CATAPRES) 0.1 MG tablet Take 1 tablet (0.1 mg total) by mouth daily. 60 tablet 11   . docusate sodium (COLACE) 100 MG capsule Take 1 capsule (100 mg total) by mouth every 12 (twelve) hours. 60  capsule 0   . loratadine (CLARITIN) 10 MG tablet Take 10 mg by mouth daily.     Marland Kitchen loratadine (CLARITIN) 10 MG tablet Take 1 tablet (10 mg total) by mouth daily. 20 tablet 0   . meloxicam (MOBIC) 15 MG tablet Take 1 tablet (15 mg total) by mouth daily. 14 tablet 0   . Meloxicam 15 MG TBDP Take 15 mg by mouth daily. 20 tablet 0   . metoprolol tartrate (LOPRESSOR) 25 MG tablet Take 0.5 tablets (12.5 mg total) by mouth 2 (two) times daily. 20 tablet 0   . montelukast (SINGULAIR) 10 MG tablet Take 10 mg by mouth daily.       No Known Allergies   ROS:  A comprehensive review of systems was negative except for: Gastrointestinal: positive for abdominal pain, nausea, vomiting and right inguinal hernia, reduced  Blood pressure (!) 148/87, pulse 87, temperature 98.6 F (37 C), temperature source Oral, resp. rate 18, height 5\' 7"  (1.702 m), weight 81.6 kg, SpO2 100 %. Physical Exam Vitals signs reviewed.  Constitutional:      Appearance: He is well-developed.  HENT:     Head: Normocephalic.  Eyes:     Extraocular Movements: Extraocular movements intact.     Pupils: Pupils are equal, round, and reactive to light.  Cardiovascular:     Rate and Rhythm:  Normal rate and regular rhythm.  Pulmonary:     Effort: Pulmonary effort is normal.  Abdominal:     General: There is distension.     Palpations: Abdomen is soft.     Tenderness: There is no abdominal tenderness.     Hernia: A hernia is present. Hernia is present in the right inguinal area. There is no hernia in the umbilical area or left inguinal area.  Musculoskeletal: Normal range of motion.  Skin:    General: Skin is warm and dry.  Neurological:     General: No focal deficit present.     Mental Status: He is alert and oriented to person, place, and time.  Psychiatric:        Mood and Affect: Mood normal.        Behavior: Behavior normal.     Results: Personally reviewed KUB with dilated small bowel  Dg Abd Portable 2  Views  Result Date: 12/28/2018 CLINICAL DATA:  Status post reduction of right inguinal hernia. EXAM: PORTABLE ABDOMEN - 2 VIEW COMPARISON:  CT 12/25/2018, 3 days prior FINDINGS: No definite bowel gas visualized in the right inguinal region. Dilated small bowel in the central abdomen measuring up to 3.3 cm with air-fluid levels. Mild gaseous gastric distension. No free air. No abnormal soft tissue calcifications or radiopaque calculi. No osseous abnormalities are seen. Included lung bases are clear. IMPRESSION: Dilated small bowel in the central abdomen with air-fluid levels, consistent with small bowel obstruction. No free air. No definite bowel gas in the right inguinal region corresponding to bowel containing hernia on prior CT. Electronically Signed   By: Keith Rake M.D.   On: 12/28/2018 06:48   CT a/p 11/13- personally reviewed-small bowel within right inguinal hernia sac  Assessment & Plan:  Daniel [name] is a 58 y.o. male with a reduced right inguinal hernia that has had multiple BMs and is not obstructed clinically. He is overall much improved and has no pain. He does not need an emergent repair, but will need a relatively urgent repair. I have discussed with him coming to clinic tomorrow to get his repair arranged. We discussed the signs and risk of incarceration and strangulation, and reasons for returning to the ED.    -Follow up with me 11/17 @ 9:45AM for hernia repair discussion -Po trial in the ED, if fails will have to admit and repair   All questions were answered to the satisfaction of the patient.   Virl Cagey 12/28/2018, 3:23 PM

## 2018-12-28 NOTE — Discharge Instructions (Addendum)
Inguinal Hernia, Adult An inguinal hernia is when fat or your intestines push through a weak spot in a muscle where your leg meets your lower belly (groin). This causes a rounded lump (bulge). This kind of hernia could also be:  In your scrotum, if you are male.  In folds of skin around your vagina, if you are male. There are three types of inguinal hernias. These include:  Hernias that can be pushed back into the belly (are reducible). This type rarely causes pain.  Hernias that cannot be pushed back into the belly (are incarcerated).  Hernias that cannot be pushed back into the belly and lose their blood supply (are strangulated). This type needs emergency surgery. If you do not have symptoms, you may not need treatment. If you have symptoms or a large hernia, you may need surgery. Follow these instructions at home: Lifestyle  Do these things if told by your doctor so you do not have trouble pooping (constipation): ? Drink enough fluid to keep your pee (urine) pale yellow. ? Eat foods that have a lot of fiber. These include fresh fruits and vegetables, whole grains, and beans. ? Limit foods that are high in fat and processed sugars. These include foods that are fried or sweet. ? Take medicine for trouble pooping.  Avoid lifting heavy objects.  Avoid standing for long amounts of time.  Do not use any products that contain nicotine or tobacco. These include cigarettes and e-cigarettes. If you need help quitting, ask your doctor.  Stay at a healthy weight. General instructions  You may try to push your hernia in by very gently pressing on it when you are lying down. Do not try to force the bulge back in if it will not push in easily.  Watch your hernia for any changes in shape, size, or color. Tell your doctor if you see any changes.  Take over-the-counter and prescription medicines only as told by your doctor.  Keep all follow-up visits as told by your doctor. This is  important. Contact a doctor if:  You have a fever.  You have new symptoms.  Your symptoms get worse. Get help right away if:  The area where your leg meets your lower belly has: ? Pain that gets worse suddenly. ? A bulge that gets bigger suddenly, and it does not get smaller after that. ? A bulge that turns red or purple. ? A bulge that is painful when you touch it.  You are a man, and your scrotum: ? Suddenly feels painful. ? Suddenly changes in size.  You cannot push the hernia in by very gently pressing on it when you are lying down. Do not try to force the bulge back in if it will not push in easily.  You feel sick to your stomach (nauseous), and that feeling does not go away.  You throw up (vomit), and that keeps happening.  You have a fast heartbeat.  You cannot poop (have a bowel movement) or pass gas. These symptoms may be an emergency. Do not wait to see if the symptoms will go away. Get medical help right away. Call your local emergency services (911 in the [place] Summary  An inguinal hernia is when fat or your intestines push through a weak spot in a muscle where your leg meets your lower belly (groin). This causes a rounded lump (bulge).  If you do not have symptoms, you may not need treatment. If you have symptoms or a large hernia, you   may need surgery.  Avoid lifting heavy objects. Also avoid standing for long amounts of time.  Do not try to force the bulge back in if it will not push in easily. This information is not intended to replace advice given to you by your health care provider. Make sure you discuss any questions you have with your health care provider. Document Released: 02/28/2006 Document Revised: 03/01/2017 Document Reviewed: 10/30/2016 Elsevier Patient Education  2020 Elsevier Inc.  

## 2018-12-28 NOTE — Progress Notes (Signed)
[facility]  Full Consult Note to Follow.  Right inguinal hernia incarcerated, reduced by Dr. Tomi Bamberger. KUB with dilated bowel concerning for obstruction but he had just been reduced. He now has had multiple BMs in the ED.   Po trial in ED Precaution, warning for incarceration and return to ED discussed with patient. If tolerates po and having BMs is not obstructed, and can go home.   Patient can come to my clinic tomorrow 11/17 @ 9:45AM for further discussion and planning for surgery.  Daniel Labrum, MD [facility] [address] [place], [place] [id number] [phone] (office)

## 2018-12-28 NOTE — ED Notes (Signed)
Pt given breakfast meal tray. 

## 2018-12-29 ENCOUNTER — Ambulatory Visit (INDEPENDENT_AMBULATORY_CARE_PROVIDER_SITE_OTHER): Payer: Self-pay | Admitting: General Surgery

## 2018-12-29 ENCOUNTER — Encounter: Payer: Self-pay | Admitting: General Surgery

## 2018-12-29 VITALS — BP 156/119 | HR 122 | Temp 98.2°F | Resp 18 | Ht 67.0 in | Wt 180.0 lb

## 2018-12-29 DIAGNOSIS — K4090 Unilateral inguinal hernia, without obstruction or gangrene, not specified as recurrent: Secondary | ICD-10-CM

## 2018-12-29 MED ORDER — OXYCODONE HCL 5 MG PO TABS: 5.0000 mg | ORAL_TABLET | ORAL | 0 refills | Status: DC | PRN

## 2018-12-29 NOTE — Patient Instructions (Addendum)
Get your metoprolol and clonidine from the pharmacy for your blood pressure and heart rate.    Hypertension, Adult Hypertension is another name for high blood pressure. High blood pressure forces your heart to work harder to pump blood. This can cause problems over time. There are two numbers in a blood pressure reading. There is a top number (systolic) over a bottom number (diastolic). It is best to have a blood pressure that is below 120/80. Healthy choices can help lower your blood pressure, or you may need medicine to help lower it. What are the causes? The cause of this condition is not known. Some conditions may be related to high blood pressure. What increases the risk?  Smoking.  Having type 2 diabetes mellitus, high cholesterol, or both.  Not getting enough exercise or physical activity.  Being overweight.  Having too much fat, sugar, calories, or salt (sodium) in your diet.  Drinking too much alcohol.  Having long-term (chronic) kidney disease.  Having a family history of high blood pressure.  Age. Risk increases with age.  Race. You may be at higher risk if you are African American.  Gender. Men are at higher risk than women before age 38. After age 21, women are at higher risk than men.  Having obstructive sleep apnea.  Stress. What are the signs or symptoms?  High blood pressure may not cause symptoms. Very high blood pressure (hypertensive crisis) may cause: ? Headache. ? Feelings of worry or nervousness (anxiety). ? Shortness of breath. ? Nosebleed. ? A feeling of being sick to your stomach (nausea). ? Throwing up (vomiting). ? Changes in how you see. ? Very bad chest pain. ? Seizures. How is this treated?  This condition is treated by making healthy lifestyle changes, such as: ? Eating healthy foods. ? Exercising more. ? Drinking less alcohol.  Your health care provider may prescribe medicine if lifestyle changes are not enough to get your blood  pressure under control, and if: ? Your top number is above 130. ? Your bottom number is above 80.  Your personal target blood pressure may vary. Follow these instructions at home: Eating and drinking   If told, follow the DASH eating plan. To follow this plan: ? Fill one half of your plate at each meal with fruits and vegetables. ? Fill one fourth of your plate at each meal with whole grains. Whole grains include whole-wheat pasta, brown rice, and whole-grain bread. ? Eat or drink low-fat dairy products, such as skim milk or low-fat yogurt. ? Fill one fourth of your plate at each meal with low-fat (lean) proteins. Low-fat proteins include fish, chicken without skin, eggs, beans, and tofu. ? Avoid fatty meat, cured and processed meat, or chicken with skin. ? Avoid pre-made or processed food.  Eat less than 1,500 mg of salt each day.  Do not drink alcohol if: ? Your doctor tells you not to drink. ? You are pregnant, may be pregnant, or are planning to become pregnant.  If you drink alcohol: ? Limit how much you use to:  0-1 drink a day for women.  0-2 drinks a day for men. ? Be aware of how much alcohol is in your drink. In the [place] one drink equals one 12 oz bottle of beer (355 mL), one 5 oz glass of wine (148 mL), or one 1 oz glass of hard liquor (44 mL). Lifestyle   Work with your doctor to stay at a healthy weight or to lose weight. Ask  your doctor what the best weight is for you.  Get at least 30 minutes of exercise most days of the week. This may include walking, swimming, or biking.  Get at least 30 minutes of exercise that strengthens your muscles (resistance exercise) at least 3 days a week. This may include lifting weights or doing Pilates.  Do not use any products that contain nicotine or tobacco, such as cigarettes, e-cigarettes, and chewing tobacco. If you need help quitting, ask your doctor.  Check your blood pressure at home as told by your doctor.  Keep all  follow-up visits as told by your doctor. This is important. Medicines  Take over-the-counter and prescription medicines only as told by your doctor. Follow directions carefully.  Do not skip doses of blood pressure medicine. The medicine does not work as well if you skip doses. Skipping doses also puts you at risk for problems.  Ask your doctor about side effects or reactions to medicines that you should watch for. Contact a doctor if you:  Think you are having a reaction to the medicine you are taking.  Have headaches that keep coming back (recurring).  Feel dizzy.  Have swelling in your ankles.  Have trouble with your vision. Get help right away if you:  Get a very bad headache.  Start to feel mixed up (confused).  Feel weak or numb.  Feel faint.  Have very bad pain in your: ? Chest. ? Belly (abdomen).  Throw up more than once.  Have trouble breathing. Summary  Hypertension is another name for high blood pressure.  High blood pressure forces your heart to work harder to pump blood.  For most people, a normal blood pressure is less than 120/80.  Making healthy choices can help lower blood pressure. If your blood pressure does not get lower with healthy choices, you may need to take medicine. This information is not intended to replace advice given to you by your health care provider. Make sure you discuss any questions you have with your health care provider. Document Released: 07/17/2007 Document Revised: 10/08/2017 Document Reviewed: 10/08/2017 Elsevier Patient Education  2020 [employer]. Inguinal Hernia, Adult An inguinal hernia is when fat or your intestines push through a weak spot in a muscle where your leg meets your lower belly (groin). This causes a rounded lump (bulge). This kind of hernia could also be:  In your scrotum, if you are male.  In folds of skin around your vagina, if you are male. There are three types of inguinal hernias. These  include:  Hernias that can be pushed back into the belly (are reducible). This type rarely causes pain.  Hernias that cannot be pushed back into the belly (are incarcerated).  Hernias that cannot be pushed back into the belly and lose their blood supply (are strangulated). This type needs emergency surgery. If you do not have symptoms, you may not need treatment. If you have symptoms or a large hernia, you may need surgery. Follow these instructions at home: Lifestyle  Do these things if told by your doctor so you do not have trouble pooping (constipation): ? Drink enough fluid to keep your pee (urine) pale yellow. ? Eat foods that have a lot of fiber. These include fresh fruits and vegetables, whole grains, and beans. ? Limit foods that are high in fat and processed sugars. These include foods that are fried or sweet. ? Take medicine for trouble pooping.  Avoid lifting heavy objects.  Avoid standing for long amounts  of time.  Do not use any products that contain nicotine or tobacco. These include cigarettes and e-cigarettes. If you need help quitting, ask your doctor.  Stay at a healthy weight. General instructions  You may try to push your hernia in by very gently pressing on it when you are lying down. Do not try to force the bulge back in if it will not push in easily.  Watch your hernia for any changes in shape, size, or color. Tell your doctor if you see any changes.  Take over-the-counter and prescription medicines only as told by your doctor.  Keep all follow-up visits as told by your doctor. This is important. Contact a doctor if:  You have a fever.  You have new symptoms.  Your symptoms get worse. Get help right away if:  The area where your leg meets your lower belly has: ? Pain that gets worse suddenly. ? A bulge that gets bigger suddenly, and it does not get smaller after that. ? A bulge that turns red or purple. ? A bulge that is painful when you touch  it.  You are a man, and your scrotum: ? Suddenly feels painful. ? Suddenly changes in size.  You cannot push the hernia in by very gently pressing on it when you are lying down. Do not try to force the bulge back in if it will not push in easily.  You feel sick to your stomach (nauseous), and that feeling does not go away.  You throw up (vomit), and that keeps happening.  You have a fast heartbeat.  You cannot poop (have a bowel movement) or pass gas. These symptoms may be an emergency. Do not wait to see if the symptoms will go away. Get medical help right away. Call your local emergency services (911 in the [place] Summary  An inguinal hernia is when fat or your intestines push through a weak spot in a muscle where your leg meets your lower belly (groin). This causes a rounded lump (bulge).  If you do not have symptoms, you may not need treatment. If you have symptoms or a large hernia, you may need surgery.  Avoid lifting heavy objects. Also avoid standing for long amounts of time.  Do not try to force the bulge back in if it will not push in easily. This information is not intended to replace advice given to you by your health care provider. Make sure you discuss any questions you have with your health care provider. Document Released: 02/28/2006 Document Revised: 03/01/2017 Document Reviewed: 10/30/2016 Elsevier Patient Education  [place] Repair, Adult  Open hernia repair is a surgical procedure to fix a hernia. A hernia occurs when an internal organ or tissue pushes out through a weak spot in the abdominal wall muscles. Hernias commonly occur in the groin and around the navel. Most hernias tend to get worse over time. Often, surgery is done to prevent the hernia from becoming bigger, uncomfortable, or an emergency. Emergency surgery may be needed if abdominal contents get stuck in the opening (incarcerated hernia) or the blood supply gets cut off  (strangulated hernia). In an open repair, an incision is made in the abdomen to perform the surgery. Tell a health care provider about:  Any allergies you have.  All medicines you are taking, including vitamins, herbs, eye drops, creams, and over-the-counter medicines.  Any problems you or family members have had with anesthetic medicines.  Any blood or bone disorders  you have.  Any surgeries you have had.  Any medical conditions you have, including any recent cold or flu symptoms.  Whether you are pregnant or may be pregnant. What are the risks? Generally, this is a safe procedure. However, problems may occur, including:  Long-lasting (chronic) pain.  Bleeding.  Infection.  Damage to the testicle. This can cause shrinking or swelling.  Damage to the bladder, blood vessels, intestine, or nerves near the hernia.  Trouble passing urine.  Allergic reactions to medicines.  Return of the hernia. Medicines  Ask your health care provider about: ? Changing or stopping your regular medicines. This is especially important if you are taking diabetes medicines or blood thinners. ? Taking medicines such as aspirin and ibuprofen. These medicines can thin your blood. Do not take these medicines before your procedure if your health care provider instructs you not to.  You may be given antibiotic medicine to help prevent infection. General instructions  You may have blood tests or imaging studies.  Ask your health care provider how your surgical site will be marked or identified.  If you smoke, do not smoke for at least 2 weeks before your procedure or for as long as told by your health care provider.  Let your health care provider know if you develop a cold or any infection before your surgery.  Plan to have someone take you home from the hospital or clinic.  If you will be going home right after the procedure, plan to have someone with you for 24 hours. What happens during the  procedure?  To reduce your risk of infection: ? Your health care team will wash or sanitize their hands. ? Your skin will be washed with soap. ? Hair may be removed from the surgical area.  An IV tube will be inserted into one of your veins.  You will be given one or more of the following: ? A medicine to help you relax (sedative). ? A medicine to numb the area (local anesthetic). ? A medicine to make you fall asleep (general anesthetic).  Your surgeon will make an incision over the hernia.  The tissues of the hernia will be moved back into place.  The edges of the hernia may be stitched together.  The opening in the abdominal muscles will be closed with stitches (sutures). Or, your surgeon will place a mesh patch made of manmade (synthetic) material over the opening.  The incision will be closed.  A bandage (dressing) may be placed over the incision. The procedure may vary among health care providers and hospitals. What happens after the procedure?  Your blood pressure, heart rate, breathing rate, and blood oxygen level will be monitored until the medicines you were given have worn off.  You may be given medicine for pain.  Do not drive for 24 hours if you received a sedative. This information is not intended to replace advice given to you by your health care provider. Make sure you discuss any questions you have with your health care provider. Document Released: 07/24/2000 Document Revised: 01/10/2017 Document Reviewed: 07/12/2015 Elsevier Patient Education  2020 [employer].

## 2018-12-29 NOTE — Progress Notes (Signed)
[facility] History and Physical  Reason for Referral: Right inguinal hernia  Referring Physician:  ED   Chief Complaint    Inguinal Hernia      Daniel [name] is a 58 y.o. male.  HPI: Daniel [name] is a very sweet 7 yo that I met in the ED yesterday who had an inguinal hernia that Dr. Tomi Bamberger was able to reduce. He had a KUB that demonstrated some dilated bowel and questioned obstruction, and he had just been reduced. He was having BMs in the ED when I saw him and his pain was improved.  He went home and I had him see me today to get him scheduled for surgery. He says that since he left the hospital that the hernia is stuck back out, and he tried to lay flat and let it go back in. He is having some coughing from his COPD, and says that is making it stick out. He has not had a BM since the ED and says he has not eaten much since that time. He has had some nausea but no vomiting.    He has not been able to get his BP meds from the [facility], and is out of his BP meds at this time.  He says he has had the hernia for sometime but noticed it bulging out on Friday and went to the ED and had to return on Monday due to it being back out.  When it is stuck out he has nausea and vomiting.  He denies any history of heart attack.   Past Medical History:  Diagnosis Date  . Arthritis   . Asthma   . COPD (chronic obstructive pulmonary disease) ([place])   . GERD (gastroesophageal reflux disease)   . Hypertension     Past Surgical History:  Procedure Laterality Date  . HAND SURGERY Right     Family History  Problem Relation Age of Onset  . Anesthesia problems Neg Hx     Social History   Tobacco Use  . Smoking status: Current Every Day Smoker    Packs/day: 1.00    Types: Cigarettes  . Smokeless tobacco: Never Used  Substance Use Topics  . Alcohol use: Yes    Comment: daily  . Drug use: No    Medications: I have reviewed the patient's current medications. He  does not have the clonidine or metoprolol medication at this time.  Allergies as of 12/29/2018   No Known Allergies     Medication List       Accurate as of December 29, 2018 11:59 PM. If you have any questions, ask your nurse or doctor.        albuterol 108 (90 Base) MCG/ACT inhaler Commonly known as: VENTOLIN HFA Inhale 1-2 puffs into the lungs every 6 (six) hours as needed for wheezing or shortness of breath.   cloNIDine 0.1 MG tablet Commonly known as: Catapres Take 1 tablet (0.1 mg total) by mouth daily.   docusate sodium 100 MG capsule Commonly known as: COLACE Take 1 capsule (100 mg total) by mouth every 12 (twelve) hours.   loratadine 10 MG tablet Commonly known as: CLARITIN Take 1 tablet (10 mg total) by mouth daily.   Meloxicam 15 MG Tbdp Take 15 mg by mouth daily.   metoprolol tartrate 25 MG tablet Commonly known as: LOPRESSOR Take 0.5 tablets (12.5 mg total) by mouth 2 (two) times daily.   montelukast 10 MG tablet Commonly known as: SINGULAIR Take  10 mg by mouth daily.   omeprazole 20 MG capsule Commonly known as: PRILOSEC Take 20 mg by mouth daily.   oxyCODONE 5 MG immediate release tablet Commonly known as: Roxicodone Take 1 tablet (5 mg total) by mouth every 4 (four) hours as needed for severe pain or breakthrough pain. Started by: Daniel Cagey, MD        ROS:  A comprehensive review of systems was negative except for: Gastrointestinal: positive for abdominal pain, nausea and vomiting Musculoskeletal: positive for neck pain and joint pain  Blood pressure (!) 156/119, pulse (!) 122, temperature 98.2 F (36.8 C), temperature source Oral, resp. rate 18, height _0  (1.702 m), weight 180 lb (81.6 kg), SpO2 97 %. Physical Exam Vitals signs reviewed.  Constitutional:      Appearance: Normal appearance.  HENT:     Head: Normocephalic and atraumatic.     Nose: Nose normal.     Mouth/Throat:     Mouth: Mucous membranes are moist.  Eyes:      Extraocular Movements: Extraocular movements intact.     Pupils: Pupils are equal, round, and reactive to light.  Neck:     Musculoskeletal: Normal range of motion. No neck rigidity.  Cardiovascular:     Rate and Rhythm: Regular rhythm. Tachycardia present.  Pulmonary:     Effort: Pulmonary effort is normal. No respiratory distress.     Breath sounds: Wheezing present.  Abdominal:     General: There is no distension.     Palpations: Abdomen is soft.     Tenderness: There is abdominal tenderness.     Hernia: A hernia is present. Hernia is present in the right inguinal area.     Comments: Reducible hernia  Musculoskeletal: Normal range of motion.        General: No swelling.  Skin:    General: Skin is warm and dry.  Neurological:     General: No focal deficit present.     Mental Status: He is alert and oriented to person, place, and time.  Psychiatric:        Mood and Affect: Mood normal.        Behavior: Behavior normal.        Thought Content: Thought content normal.        Judgment: Judgment normal.     Results: Personally reviewed imaging  CT 11/13- right inguinal hernia with bowel Xray 11/16 dilated small bowel, ? Obstruction   Assessment & Plan:  Daniel [name] is a 58 y.o. male with a right inguinal hernia that was reduced in the ED and in my office again. He needs this repaired. I have shown him how to push the hernia back in, and when it is in he is fine. The patient needs to get his BP medications and start taking them due to his BP and HR.  I have told him and sister this, and they are going today to get his medications from [place].    -Roxicodone Rx for pain from hernia -Precautions given for going back to ED if the hernia gets stuck back out  -OR for hernia repair with mesh as soon as possible  -Warned him of dangers of anesthesia if he does not get his BP/ HR better prior   Discussed the risk and benefits including, bleeding, infection, use of mesh, risk of  recurrence, risk of nerve damage causing numbness or changes in sensation, risk of damage to the cord structures. The patient understands the risk  and benefits of repair with mesh, and has decided to proceed.  We also discussed open versus laparoscopic surgery and the use of mesh. We discussed that I do open repairs with mesh, and that this is considered equivalent to laparoscopic surgery. We discussed reasons for opting for laparoscopic surgery including if a bilateral repair is needed or if a patient has a recurrence after an open repair.  All questions were answered to the satisfaction of the patient and family.    Daniel [name] 01/02/2019, 10:37 AM

## 2018-12-31 ENCOUNTER — Encounter (HOSPITAL_COMMUNITY)
Admission: RE | Admit: 2018-12-31 | Discharge: 2018-12-31 | Disposition: A | Discharge status: Home / Self Care | Payer: Self-pay | Source: Ambulatory Visit | Attending: General Surgery | Admitting: General Surgery

## 2018-12-31 ENCOUNTER — Other Ambulatory Visit: Payer: Self-pay

## 2018-12-31 ENCOUNTER — Other Ambulatory Visit (HOSPITAL_COMMUNITY)
Admission: RE | Admit: 2018-12-31 | Discharge: 2018-12-31 | Disposition: A | Discharge status: Home / Self Care | Payer: Self-pay | Source: Ambulatory Visit | Attending: General Surgery | Admitting: General Surgery

## 2018-12-31 DIAGNOSIS — Z01812 Encounter for preprocedural laboratory examination: Secondary | ICD-10-CM | POA: Insufficient documentation

## 2018-12-31 DIAGNOSIS — Z20828 Contact with and (suspected) exposure to other viral communicable diseases: Secondary | ICD-10-CM | POA: Insufficient documentation

## 2018-12-31 LAB — SARS CORONAVIRUS 2 (TAT 6-24 HRS): SARS Coronavirus 2: NEGATIVE

## 2019-01-02 ENCOUNTER — Encounter: Payer: Self-pay | Admitting: General Surgery

## 2019-01-02 NOTE — H&P (Signed)
[facility] History and Physical  Reason for Referral: Right inguinal hernia  Referring Physician:  ED   Chief Complaint    Inguinal Hernia      Trayvon R Shartzer is a 58 y.o. male.  HPI: Mr. Kinzler is a very sweet 58 yo that I met in the ED yesterday who had an inguinal hernia that Dr. Knapp was able to reduce. He had a KUB that demonstrated some dilated bowel and questioned obstruction, and he had just been reduced. He was having BMs in the ED when I saw him and his pain was improved.  He went home and I had him see me today to get him scheduled for surgery. He says that since he left the hospital that the hernia is stuck back out, and he tried to lay flat and let it go back in. He is having some coughing from his COPD, and says that is making it stick out. He has not had a BM since the ED and says he has not eaten much since that time. He has had some nausea but no vomiting.    He has not been able to get his BP meds from the [facility], and is out of his BP meds at this time.  He says he has had the hernia for sometime but noticed it bulging out on Friday and went to the ED and had to return on Monday due to it being back out.  When it is stuck out he has nausea and vomiting.  He denies any history of heart attack.   Past Medical History:  Diagnosis Date  . Arthritis   . Asthma   . COPD (chronic obstructive pulmonary disease) ([facility])   . GERD (gastroesophageal reflux disease)   . Hypertension     Past Surgical History:  Procedure Laterality Date  . HAND SURGERY Right     Family History  Problem Relation Age of Onset  . Anesthesia problems Neg Hx     Social History   Tobacco Use  . Smoking status: Current Every Day Smoker    Packs/day: 1.00    Types: Cigarettes  . Smokeless tobacco: Never Used  Substance Use Topics  . Alcohol use: Yes    Comment: daily  . Drug use: No    Medications: I have reviewed the patient's current medications. He  does not have the clonidine or metoprolol medication at this time.  Allergies as of 12/29/2018   No Known Allergies     Medication List       Accurate as of December 29, 2018 11:59 PM. If you have any questions, ask your nurse or doctor.        albuterol 108 (90 Base) MCG/ACT inhaler Commonly known as: VENTOLIN HFA Inhale 1-2 puffs into the lungs every 6 (six) hours as needed for wheezing or shortness of breath.   cloNIDine 0.1 MG tablet Commonly known as: Catapres Take 1 tablet (0.1 mg total) by mouth daily.   docusate sodium 100 MG capsule Commonly known as: COLACE Take 1 capsule (100 mg total) by mouth every 12 (twelve) hours.   loratadine 10 MG tablet Commonly known as: CLARITIN Take 1 tablet (10 mg total) by mouth daily.   Meloxicam 15 MG Tbdp Take 15 mg by mouth daily.   metoprolol tartrate 25 MG tablet Commonly known as: LOPRESSOR Take 0.5 tablets (12.5 mg total) by mouth 2 (two) times daily.   montelukast 10 MG tablet Commonly known as: SINGULAIR Take   10 mg by mouth daily.   omeprazole 20 MG capsule Commonly known as: PRILOSEC Take 20 mg by mouth daily.   oxyCODONE 5 MG immediate release tablet Commonly known as: Roxicodone Take 1 tablet (5 mg total) by mouth every 4 (four) hours as needed for severe pain or breakthrough pain. Started by: Cobin Cadavid C Jazzlyn Huizenga, MD        ROS:  A comprehensive review of systems was negative except for: Gastrointestinal: positive for abdominal pain, nausea and vomiting Musculoskeletal: positive for neck pain and joint pain  Blood pressure (!) 156/119, pulse (!) 122, temperature 98.2 F (36.8 C), temperature source Oral, resp. rate 18, height 5' 7" (1.702 m), weight 180 lb (81.6 kg), SpO2 97 %. Physical Exam Vitals signs reviewed.  Constitutional:      Appearance: Normal appearance.  HENT:     Head: Normocephalic and atraumatic.     Nose: Nose normal.     Mouth/Throat:     Mouth: Mucous membranes are moist.  Eyes:      Extraocular Movements: Extraocular movements intact.     Pupils: Pupils are equal, round, and reactive to light.  Neck:     Musculoskeletal: Normal range of motion. No neck rigidity.  Cardiovascular:     Rate and Rhythm: Regular rhythm. Tachycardia present.  Pulmonary:     Effort: Pulmonary effort is normal. No respiratory distress.     Breath sounds: Wheezing present.  Abdominal:     General: There is no distension.     Palpations: Abdomen is soft.     Tenderness: There is abdominal tenderness.     Hernia: A hernia is present. Hernia is present in the right inguinal area.     Comments: Reducible hernia  Musculoskeletal: Normal range of motion.        General: No swelling.  Skin:    General: Skin is warm and dry.  Neurological:     General: No focal deficit present.     Mental Status: He is alert and oriented to person, place, and time.  Psychiatric:        Mood and Affect: Mood normal.        Behavior: Behavior normal.        Thought Content: Thought content normal.        Judgment: Judgment normal.     Results: Personally reviewed imaging  CT 11/13- right inguinal hernia with bowel Xray 11/16 dilated small bowel, ? Obstruction   Assessment & Plan:  Mickle R Smithey is a 58 y.o. male with a right inguinal hernia that was reduced in the ED and in my office again. He needs this repaired. I have shown him how to push the hernia back in, and when it is in he is fine. The patient needs to get his BP medications and start taking them due to his BP and HR.  I have told him and sister this, and they are going today to get his medications from [facility].    -Roxicodone Rx for pain from hernia -Precautions given for going back to ED if the hernia gets stuck back out  -OR for hernia repair with mesh as soon as possible  -Warned him of dangers of anesthesia if he does not get his BP/ HR better prior   Discussed the risk and benefits including, bleeding, infection, use of mesh, risk of  recurrence, risk of nerve damage causing numbness or changes in sensation, risk of damage to the cord structures. The patient understands the risk   and benefits of repair with mesh, and has decided to proceed.  We also discussed open versus laparoscopic surgery and the use of mesh. We discussed that I do open repairs with mesh, and that this is considered equivalent to laparoscopic surgery. We discussed reasons for opting for laparoscopic surgery including if a bilateral repair is needed or if a patient has a recurrence after an open repair.  All questions were answered to the satisfaction of the patient and family.    Margart Zemanek C Erica Richwine 01/02/2019, 10:37 AM       

## 2019-01-03 ENCOUNTER — Other Ambulatory Visit: Payer: Self-pay

## 2019-01-04 ENCOUNTER — Ambulatory Visit (HOSPITAL_COMMUNITY): Payer: Self-pay | Admitting: Anesthesiology

## 2019-01-04 ENCOUNTER — Ambulatory Visit (HOSPITAL_COMMUNITY)
Admission: RE | Admit: 2019-01-04 | Discharge: 2019-01-04 | Disposition: A | Discharge status: Home / Self Care | Payer: Self-pay | Attending: General Surgery | Admitting: General Surgery

## 2019-01-04 ENCOUNTER — Encounter (HOSPITAL_COMMUNITY): Payer: Self-pay | Admitting: *Deleted

## 2019-01-04 ENCOUNTER — Encounter (HOSPITAL_COMMUNITY)
Admission: RE | Disposition: A | Discharge status: Home / Self Care | Payer: Self-pay | Source: Home / Self Care | Attending: General Surgery

## 2019-01-04 DIAGNOSIS — J449 Chronic obstructive pulmonary disease, unspecified: Secondary | ICD-10-CM | POA: Insufficient documentation

## 2019-01-04 DIAGNOSIS — I10 Essential (primary) hypertension: Secondary | ICD-10-CM | POA: Insufficient documentation

## 2019-01-04 DIAGNOSIS — F17210 Nicotine dependence, cigarettes, uncomplicated: Secondary | ICD-10-CM | POA: Insufficient documentation

## 2019-01-04 DIAGNOSIS — Z791 Long term (current) use of non-steroidal anti-inflammatories (NSAID): Secondary | ICD-10-CM | POA: Insufficient documentation

## 2019-01-04 DIAGNOSIS — K4090 Unilateral inguinal hernia, without obstruction or gangrene, not specified as recurrent: Secondary | ICD-10-CM

## 2019-01-04 DIAGNOSIS — K219 Gastro-esophageal reflux disease without esophagitis: Secondary | ICD-10-CM | POA: Insufficient documentation

## 2019-01-04 DIAGNOSIS — Z79899 Other long term (current) drug therapy: Secondary | ICD-10-CM | POA: Insufficient documentation

## 2019-01-04 DIAGNOSIS — K56609 Unspecified intestinal obstruction, unspecified as to partial versus complete obstruction: Secondary | ICD-10-CM | POA: Insufficient documentation

## 2019-01-04 HISTORY — PX: INGUINAL HERNIA REPAIR: SHX194

## 2019-01-04 SURGERY — REPAIR, HERNIA, INGUINAL, ADULT
Anesthesia: General | Laterality: Right

## 2019-01-04 MED ORDER — PROPOFOL 10 MG/ML IV BOLUS
INTRAVENOUS | Status: AC
  Filled 2019-01-04: qty 40

## 2019-01-04 MED ORDER — FENTANYL CITRATE (PF) 100 MCG/2ML IJ SOLN
INTRAMUSCULAR | Status: DC | PRN
  Administered 2019-01-04 (×5): 50 ug via INTRAVENOUS

## 2019-01-04 MED ORDER — ROCURONIUM BROMIDE 100 MG/10ML IV SOLN
INTRAVENOUS | Status: DC | PRN
  Administered 2019-01-04: 50 mg via INTRAVENOUS

## 2019-01-04 MED ORDER — PROPOFOL 10 MG/ML IV BOLUS
INTRAVENOUS | Status: DC | PRN
  Administered 2019-01-04: 200 mg via INTRAVENOUS

## 2019-01-04 MED ORDER — SEVOFLURANE IN SOLN
RESPIRATORY_TRACT | Status: AC
  Filled 2019-01-04: qty 250

## 2019-01-04 MED ORDER — SODIUM CHLORIDE 0.9 % IR SOLN
Status: DC | PRN
  Administered 2019-01-04: 1000 mL

## 2019-01-04 MED ORDER — BUPIVACAINE LIPOSOME 1.3 % IJ SUSP
INTRAMUSCULAR | Status: AC
  Filled 2019-01-04: qty 20

## 2019-01-04 MED ORDER — HYDROCODONE-ACETAMINOPHEN 7.5-325 MG PO TABS
1.0000 | ORAL_TABLET | Freq: Once | ORAL | Status: AC | PRN
  Administered 2019-01-04: 1 via ORAL
  Filled 2019-01-04: qty 1

## 2019-01-04 MED ORDER — IPRATROPIUM-ALBUTEROL 0.5-2.5 (3) MG/3ML IN SOLN
3.0000 mL | Freq: Once | RESPIRATORY_TRACT | Status: AC
  Administered 2019-01-04: 3 mL via RESPIRATORY_TRACT

## 2019-01-04 MED ORDER — SIMETHICONE 80 MG PO CHEW: 80.0000 mg | CHEWABLE_TABLET | Freq: Four times a day (QID) | ORAL | 1 refills | Status: DC | PRN

## 2019-01-04 MED ORDER — CHLORHEXIDINE GLUCONATE CLOTH 2 % EX PADS: 6.0000 | MEDICATED_PAD | Freq: Once | CUTANEOUS | Status: DC

## 2019-01-04 MED ORDER — LIDOCAINE HCL (CARDIAC) PF 100 MG/5ML IV SOSY
PREFILLED_SYRINGE | INTRAVENOUS | Status: DC | PRN
  Administered 2019-01-04: 50 mg via INTRAVENOUS

## 2019-01-04 MED ORDER — SUGAMMADEX SODIUM 200 MG/2ML IV SOLN
INTRAVENOUS | Status: DC | PRN
  Administered 2019-01-04: 326.4 mg via INTRAVENOUS

## 2019-01-04 MED ORDER — DEXAMETHASONE SODIUM PHOSPHATE 4 MG/ML IJ SOLN
INTRAMUSCULAR | Status: DC | PRN
  Administered 2019-01-04: 8 mg via INTRAVENOUS

## 2019-01-04 MED ORDER — FENTANYL CITRATE (PF) 250 MCG/5ML IJ SOLN
INTRAMUSCULAR | Status: AC
  Filled 2019-01-04: qty 5

## 2019-01-04 MED ORDER — SUCCINYLCHOLINE CHLORIDE 20 MG/ML IJ SOLN
INTRAMUSCULAR | Status: DC | PRN
  Administered 2019-01-04: 140 mg via INTRAVENOUS

## 2019-01-04 MED ORDER — LACTATED RINGERS IV SOLN
INTRAVENOUS | Status: DC
  Administered 2019-01-04: 1000 mL via INTRAVENOUS

## 2019-01-04 MED ORDER — ONDANSETRON HCL 4 MG PO TABS: 4.0000 mg | ORAL_TABLET | Freq: Every day | ORAL | 1 refills | Status: DC | PRN

## 2019-01-04 MED ORDER — PROMETHAZINE HCL 25 MG/ML IJ SOLN: 6.2500 mg | INTRAMUSCULAR | Status: DC | PRN

## 2019-01-04 MED ORDER — MIDAZOLAM HCL 2 MG/2ML IJ SOLN: 0.5000 mg | Freq: Once | INTRAMUSCULAR | Status: DC | PRN

## 2019-01-04 MED ORDER — ROCURONIUM BROMIDE 10 MG/ML (PF) SYRINGE
PREFILLED_SYRINGE | INTRAVENOUS | Status: AC
  Filled 2019-01-04: qty 10

## 2019-01-04 MED ORDER — CEFAZOLIN SODIUM-DEXTROSE 2-4 GM/100ML-% IV SOLN
2.0000 g | INTRAVENOUS | Status: AC
  Administered 2019-01-04: 2 g via INTRAVENOUS
  Filled 2019-01-04: qty 100

## 2019-01-04 MED ORDER — ONDANSETRON HCL 4 MG/2ML IJ SOLN
INTRAMUSCULAR | Status: DC | PRN
  Administered 2019-01-04: 4 mg via INTRAVENOUS

## 2019-01-04 MED ORDER — BUPIVACAINE LIPOSOME 1.3 % IJ SUSP
INTRAMUSCULAR | Status: DC | PRN
  Administered 2019-01-04: 20 mL

## 2019-01-04 MED ORDER — HYDROMORPHONE HCL 1 MG/ML IJ SOLN: 0.2500 mg | INTRAMUSCULAR | Status: DC | PRN

## 2019-01-04 MED ORDER — OXYCODONE HCL 5 MG PO TABS: 5.0000 mg | ORAL_TABLET | ORAL | 0 refills | Status: DC | PRN

## 2019-01-04 MED ORDER — IPRATROPIUM-ALBUTEROL 0.5-2.5 (3) MG/3ML IN SOLN
RESPIRATORY_TRACT | Status: AC
  Filled 2019-01-04: qty 3

## 2019-01-04 MED ORDER — LIDOCAINE 2% (20 MG/ML) 5 ML SYRINGE
INTRAMUSCULAR | Status: AC
  Filled 2019-01-04: qty 5

## 2019-01-04 MED ORDER — DOCUSATE SODIUM 100 MG PO CAPS: 100.0000 mg | ORAL_CAPSULE | Freq: Two times a day (BID) | ORAL | 0 refills | Status: DC

## 2019-01-04 SURGICAL SUPPLY — 42 items
ADH SKN CLS APL DERMABOND .7 (GAUZE/BANDAGES/DRESSINGS) ×1
APL PRP STRL LF DISP 70% ISPRP (MISCELLANEOUS) ×1
CHLORAPREP W/TINT 26 (MISCELLANEOUS) ×2 IMPLANT
CLOTH BEACON ORANGE TIMEOUT ST (SAFETY) ×3 IMPLANT
COVER LIGHT HANDLE STERIS (MISCELLANEOUS) ×6 IMPLANT
COVER WAND RF STERILE (DRAPES) ×5 IMPLANT
DERMABOND ADVANCED (GAUZE/BANDAGES/DRESSINGS) ×2
DERMABOND ADVANCED .7 DNX12 (GAUZE/BANDAGES/DRESSINGS) ×1 IMPLANT
DRAIN PENROSE 18X1/2 LTX STRL (DRAIN) ×3 IMPLANT
ELECT REM PT RETURN 9FT ADLT (ELECTROSURGICAL) ×3
ELECTRODE REM PT RTRN 9FT ADLT (ELECTROSURGICAL) ×1 IMPLANT
GAUZE SPONGE 4X4 12PLY STRL (GAUZE/BANDAGES/DRESSINGS) ×3 IMPLANT
GLOVE BIO SURGEON STRL SZ 6.5 (GLOVE) ×2 IMPLANT
GLOVE BIO SURGEONS STRL SZ 6.5 (GLOVE) ×1
GLOVE BIOGEL PI IND STRL 6.5 (GLOVE) ×1 IMPLANT
GLOVE BIOGEL PI IND STRL 7.0 (GLOVE) ×3 IMPLANT
GLOVE BIOGEL PI INDICATOR 6.5 (GLOVE) ×2
GLOVE BIOGEL PI INDICATOR 7.0 (GLOVE) ×12
GLOVE ECLIPSE 6.5 STRL STRAW (GLOVE) ×4 IMPLANT
GOWN STRL REUS W/TWL LRG LVL3 (GOWN DISPOSABLE) ×11 IMPLANT
INST SET MINOR GENERAL (KITS) ×3 IMPLANT
KIT TURNOVER KIT A (KITS) ×3 IMPLANT
MANIFOLD NEPTUNE II (INSTRUMENTS) ×3 IMPLANT
MESH HERNIA 1.6X1.9 PLUG LRG (Mesh General) IMPLANT
MESH HERNIA PLUG LRG (Mesh General) ×2 IMPLANT
NDL HYPO 18GX1.5 BLUNT FILL (NEEDLE) ×1 IMPLANT
NDL HYPO 21X1.5 SAFETY (NEEDLE) ×1 IMPLANT
NEEDLE HYPO 18GX1.5 BLUNT FILL (NEEDLE) ×3 IMPLANT
NEEDLE HYPO 21X1.5 SAFETY (NEEDLE) ×3 IMPLANT
NS IRRIG 1000ML POUR BTL (IV SOLUTION) ×3 IMPLANT
PACK MINOR (CUSTOM PROCEDURE TRAY) ×3 IMPLANT
PAD ARMBOARD 7.5X6 YLW CONV (MISCELLANEOUS) ×3 IMPLANT
PENCIL HANDSWITCHING (ELECTRODE) ×2 IMPLANT
SET BASIN LINEN APH (SET/KITS/TRAYS/PACK) ×3 IMPLANT
SUT MNCRL AB 4-0 PS2 18 (SUTURE) ×3 IMPLANT
SUT NOVA NAB GS-22 2 2-0 T-19 (SUTURE) ×8 IMPLANT
SUT VIC AB 2-0 CT1 27 (SUTURE) ×3
SUT VIC AB 2-0 CT1 TAPERPNT 27 (SUTURE) ×1 IMPLANT
SUT VIC AB 3-0 SH 27 (SUTURE) ×6
SUT VIC AB 3-0 SH 27X BRD (SUTURE) ×1 IMPLANT
SYR 20ML LL LF (SYRINGE) ×3 IMPLANT
SYR 30ML LL (SYRINGE) ×3 IMPLANT

## 2019-01-04 NOTE — Anesthesia Preprocedure Evaluation (Addendum)
Anesthesia Evaluation  Patient identified by MRN, date of birth, ID band Patient awake    Reviewed: Allergy & Precautions, NPO status , Patient's Chart, lab work & pertinent test results, reviewed documented beta blocker date and time   Airway Mallampati: I  TM Distance: >3 FB Neck ROM: Full    Dental no notable dental hx. (+) Teeth Intact   Pulmonary asthma , COPD,  COPD inhaler, Current SmokerPatient did not abstain from smoking.,  Wheezing today -smoked today  Will give preop Duo Neb   Pulmonary exam normal breath sounds clear to auscultation       Cardiovascular Exercise Tolerance: Poor hypertension, Pt. on medications and Pt. on home beta blockers negative cardio ROS Normal cardiovascular examII Rhythm:Regular Rate:Normal  States can walk half a block    Neuro/Psych PSYCHIATRIC DISORDERS negative neurological ROS     GI/Hepatic Neg liver ROS, GERD  Medicated and Controlled,  Endo/Other  negative endocrine ROS  Renal/GU negative Renal ROS  negative genitourinary   Musculoskeletal  (+) Arthritis , Osteoarthritis,    Abdominal   Peds negative pediatric ROS (+)  Hematology negative hematology ROS (+)   Anesthesia Other Findings   Reproductive/Obstetrics negative OB ROS                            Anesthesia Physical Anesthesia Plan  ASA: III  Anesthesia Plan: General   Post-op Pain Management:    Induction: Intravenous  PONV Risk Score and Plan: 1 and Midazolam, Ondansetron and Treatment may vary due to age or medical condition  Airway Management Planned: Oral ETT  Additional Equipment:   Intra-op Plan:   Post-operative Plan: Extubation in OR  Informed Consent: I have reviewed the patients History and Physical, chart, labs and discussed the procedure including the risks, benefits and alternatives for the proposed anesthesia with the patient or authorized representative who  has indicated his/her understanding and acceptance.     Dental advisory given  Plan Discussed with: CRNA  Anesthesia Plan Comments: (Plan Full PPE use Plan GETA D/W PT -WTP with same after Q&A   After D/w DR. B. Will use ETT and relaxation )       Anesthesia Quick Evaluation

## 2019-01-04 NOTE — Progress Notes (Addendum)
[facility]  Notified Geradline that surgery is complete. Patient did well. Rx to [facility]. He has been having intermittent small bowel obstructions when his hernia is stuck out and this should improve. He should be able ice the area and can elevate his scrotum to help swelling.  Sent in Rx for simethicone and zofran since he has been obstructed and feels bloated. This should help him for the time being until he gets the gas out.  Curlene Labrum, MD [facility] [address] [place], [place] [id number] [phone] (office)

## 2019-01-04 NOTE — Discharge Instructions (Signed)
Discharge Instructions Hernia:  Common Complaints: Pain at the incision site is common. This will improve with time. Take your pain medications as described below. Some nausea is common and poor appetite. The main goal is to stay hydrated the first few days after surgery.  Numbness at the incision or the thigh is common.  If you start to have burning or tingling pain in your groin, this is from a nerve being pinched. Please call and we can prescribe you a different type of pain medication for nerve pain.  You have had an intermittent bowel obstruction from your hernia. You should slowly be able to start eating more regularly and having BMs regularly.  You can place ice on the area, and elevate the scrotum with a towel to help with the swelling and bruising.   Diet/ Activity: Diet as tolerated. You may not have an appetite, but it is important to stay hydrated.  Drink 64 ounces of water a day. Your appetite will return with time.  Shower per your regular routine daily.  Do not take hot showers. Take warm showers that are less than 10 minutes. Rest and listen to your body, but do not remain in bed all day.  Walk everyday for at least 15-20 minutes. Deep cough and move around every 1-2 hours in the first few days after surgery.  Do not pick at the dermabond glue on your incision sites.  This glue film will remain in place for 1-2 weeks and will start to peel off.  Do not place lotions or balms on your incision unless instructed to specifically by Dr. Constance Haw.  Do not lift > 10 lbs, perform excessive bending, pushing, pulling, squatting for 6-8 weeks after surgery.    Medication: Take tylenol and ibuprofen as needed for pain control, alternating every 4-6 hours.  Example:  Tylenol 1000mg  @ 6am, 12noon, 6pm, 65midnight (Do not exceed 4000mg  of tylenol a day). Ibuprofen 800mg  @ 9am, 3pm, 9pm, 3am (Do not exceed 3600mg  of ibuprofen a day).  Take Roxicodone for breakthrough pain every 4 hours.    Take Colace for constipation related to narcotic pain medication. If you do not have a bowel movement in 2 days, take Miralax over the counter.  Drink plenty of water to also prevent constipation.   Contact Information: If you have questions or concerns, please call our office, [phone], Monday- Thursday 8AM-5PM and Friday 8AM-12Noon.  If it is after hours or on the weekend, please call [facility], [phone], and ask to speak to the surgeon on call for Dr. Constance Haw at [facility].    Open Hernia Repair, Adult, Care After These instructions give you information about caring for yourself after your procedure. Your doctor may also give you more specific instructions. If you have problems or questions, contact your doctor. Follow these instructions at home: Surgical cut (incision) care   Follow instructions from your doctor about how to take care of your surgical cut area. Make sure you: ? Wash your hands with soap and water before you change your bandage (dressing). If you cannot use soap and water, use hand sanitizer. ? Change your bandage as told by your doctor. ? Leave stitches (sutures), skin glue, or skin tape (adhesive) strips in place. They may need to stay in place for 2 weeks or longer. If tape strips get loose and curl up, you may trim the loose edges. Do not remove tape strips completely unless your doctor says it is okay.  Check your surgical  cut every day for signs of infection. Check for: ? More redness, swelling, or pain. ? More fluid or blood. ? Warmth. ? Pus or a bad smell. Activity  Do not drive or use heavy machinery while taking prescription pain medicine. Do not drive until your doctor says it is okay.  Until your doctor says it is okay: ? Do not lift anything that is heavier than 10 lb (4.5 kg). ? Do not play contact sports.  Return to your normal activities as told by your doctor. Ask your doctor what activities are safe. General instructions  To  prevent or treat having a hard time pooping (constipation) while you are taking prescription pain medicine, your doctor may recommend that you: ? Drink enough fluid to keep your pee (urine) clear or pale yellow. ? Take over-the-counter or prescription medicines. ? Eat foods that are high in fiber, such as fresh fruits and vegetables, whole grains, and beans. ? Limit foods that are high in fat and processed sugars, such as fried and sweet foods.  Take over-the-counter and prescription medicines only as told by your doctor.  Do not take baths, swim, or use a hot tub until your doctor says it is okay.  You may shower.   Keep all follow-up visits as told by your doctor. This is important. Contact a doctor if:  You develop a rash.  You have more redness, swelling, or pain around your surgical cut.  You have more fluid or blood coming from your surgical cut.  Your surgical cut feels warm to the touch.  You have pus or a bad smell coming from your surgical cut.  You have a fever or chills.  You have blood in your poop (stool).  You have not pooped in 2-3 days.  Medicine does not help your pain. Get help right away if:  You have chest pain or you are short of breath.  You feel light-headed.  You feel weak and dizzy (feel faint).  You have very bad pain.  You throw up (vomit) and your pain is worse.   General Anesthesia, Adult, Care After This sheet gives you information about how to care for yourself after your procedure. Your health care provider may also give you more specific instructions. If you have problems or questions, contact your health care provider. What can I expect after the procedure? After the procedure, the following side effects are common:  Pain or discomfort at the IV site.  Nausea.  Vomiting.  Sore throat.  Trouble concentrating.  Feeling cold or chills.  Weak or tired.  Sleepiness and fatigue.  Soreness and body aches. These side effects  can affect parts of the body that were not involved in surgery. Follow these instructions at home:  For at least 24 hours after the procedure:  Have a responsible adult stay with you. It is important to have someone help care for you until you are awake and alert.  Rest as needed.  Do not: ? Participate in activities in which you could fall or become injured. ? Drive. ? Use heavy machinery. ? Drink alcohol. ? Take sleeping pills or medicines that cause drowsiness. ? Make important decisions or sign legal documents. ? Take care of children on your own. Eating and drinking  Follow any instructions from your health care provider about eating or drinking restrictions.  When you feel hungry, start by eating small amounts of foods that are soft and easy to digest (bland), such as toast. Gradually return to  your regular diet.  Drink enough fluid to keep your urine pale yellow.  If you vomit, rehydrate by drinking water, juice, or clear broth. General instructions  If you have sleep apnea, surgery and certain medicines can increase your risk for breathing problems. Follow instructions from your health care provider about wearing your sleep device: ? Anytime you are sleeping, including during daytime naps. ? While taking prescription pain medicines, sleeping medicines, or medicines that make you drowsy.  Return to your normal activities as told by your health care provider. Ask your health care provider what activities are safe for you.  Take over-the-counter and prescription medicines only as told by your health care provider.  If you smoke, do not smoke without supervision.  Keep all follow-up visits as told by your health care provider. This is important. Contact a health care provider if:  You have nausea or vomiting that does not get better with medicine.  You cannot eat or drink without vomiting.  You have pain that does not get better with medicine.  You are unable to  pass urine.  You develop a skin rash.  You have a fever.  You have redness around your IV site that gets worse. Get help right away if:  You have difficulty breathing.  You have chest pain.  You have blood in your urine or stool, or you vomit blood. Summary  After the procedure, it is common to have a sore throat or nausea. It is also common to feel tired.  Have a responsible adult stay with you for the first 24 hours after general anesthesia. It is important to have someone help care for you until you are awake and alert.  When you feel hungry, start by eating small amounts of foods that are soft and easy to digest (bland), such as toast. Gradually return to your regular diet.  Drink enough fluid to keep your urine pale yellow.  Return to your normal activities as told by your health care provider. Ask your health care provider what activities are safe for you. This information is not intended to replace advice given to you by your health care provider. Make sure you discuss any questions you have with your health care provider. Document Released: 05/06/2000 Document Revised: 01/31/2017 Document Reviewed: 09/13/2016 Elsevier Patient Education  [place].  This information is not intended to replace advice given to you by your health care provider. Make sure you discuss any questions you have with your health care provider. Document Released: 02/18/2014 Document Revised: 05/22/2018 Document Reviewed: 07/12/2015 Elsevier Patient Education  2020 [employer].

## 2019-01-04 NOTE — Op Note (Signed)
[facility] Operative Note  01/04/19  Preoperative Diagnosis: Right inguinal hernia, intermittent small bowel obstruction    Postoperative Diagnosis: Same    Procedure(s) Performed: Right inguinal hernia repair with mesh   Surgeon: Lanell Matar. Constance Haw, MD   Assistants: No qualified resident was available   Anesthesia: General endotracheal   Anesthesiologist: Lenice Llamas, MD    Specimens: None   Estimated Blood Loss: Minimal   Blood Replacement: None    Complications: None   Wound Class: Clean   Operative Indications: Daniel [name] is a 58 yo with a right inguinal hernia that is reducible, but that has been getting stuck out and causing him intermittent SBO. He says he has been eating and drinking liquids and having BMs but he still feels bloated. He was in the ED last week and had his hernia reduced and a Xray with concern for SBO but was clinically improved, having Bms after the reduction. We got him scheduled for surgery as soon as possible as he needed to get back on his BP medications which he had been off of for the week prior.  He now is back on his meds, and is ready for repair. We discussed the risk of bleeding, infection, mesh use, recurrence, injury to nerve or nerve pain, and injury to the testicle. He opted to proceed.   Findings: Large right inguinal hernia    Procedure: The patient was taken to the operating room and placed supine. General endotracheal anesthesia was induced. Intravenous antibiotics were  administered per protocol.  A time out was preformed verifying the correct patient, procedure, site, positioning and implants.  The right groin and scrotum were prepared and draped in the usual sterile fashion.   An incision was made in a natural skin crease between the pubic tubercle and the anterior superior iliac spine.  The incision was deepened with electrocautery through Scarpa's and Camper's fascia until the aponeurosis of the external  oblique was encountered.  This was cleaned and the external ring was exposed.  An incision was made in the midportion of the external oblique aponeurosis in the direction of its fibers. The ilioinguinal nerve was identified and was protected throughout the dissection.  Flaps of the external oblique were developed cephalad and inferiorly.    The cord was identified and it was gently dissented free at the pubic tubercle and encircled with a Penrose drain.  Attention was then directed at the anteromedial aspect of the cord, where an indirect hernia sac was identified.  The hernia was chronic innature with a lot of cremasteric tissue surrounding the sac.  The sac was carefully dissected free from the cord down to the level of the internal ring.  The vas and testicular vessels were identified and protected from harm.  Once the sac was dissected free from the cords, the Penrose was placed around the cord which was retracted inferiorly out of the field of view.  The hernia was reduced into the internal ring without difficulty. A small tear in the hernia sac was noted and fluid was seen. This small tear was repaired with 3-0 Vicryl suture.   A large Perfix Plug was placed into the defect and filled the space.  Attention was then turned to the floor of the canal, which was grossly weakened without any defined defect or sac.  The Perfix Mesh Patch was sutured to the inguinal ligament inferiorly starting at the pubic tubercle using 2-0 Novafil interrupted sutures.  The mesh was sutured superiorly to  the conjoint tendon using 2-0 Novafil interrupted sutures.  Care was taken to ensure the mesh was placed in a relaxed fashion to avoid excessive tension and no neurovascular structures were caught in the repair.  Laterally the tails of the mesh were crossed and the internal ring was recreated, allowing for passage of cords without tension.   Hemostasis was adequate.  The Penrose was removed.  The external oblique aponeurosis  was closed with a 2-0 Vicryl suture in a running fashion, taking care to not catch the ilioinguinal nerve in the suture line.  Scarpa's fashion was closed with 3-0 Vicryl interrupted sutures. The skin was closed with a subcuticular 4-0 Monocryl suture.  Dermabond was applied.   The testis was gently pulled down into its anatomic position in the scrotum.  The patient tolerated the procedure well and was taken to the PACU in stable condition. All counts were correct at the end of the case.        Daniel Labrum, MD [facility] [address] [place], [place] [address] [phone] (office)

## 2019-01-04 NOTE — Anesthesia Postprocedure Evaluation (Signed)
Anesthesia Post Note Late entry for 0945  Patient: Daniel [name]  Procedure(s) Performed: RIGHT INGUINAL HERNIA REPAIR WITH MESH (Right )  Patient location during evaluation: PACU Anesthesia Type: General Level of consciousness: awake and alert and oriented Pain management: pain level controlled Vital Signs Assessment: post-procedure vital signs reviewed and stable Respiratory status: spontaneous breathing Cardiovascular status: stable Postop Assessment: no apparent nausea or vomiting Anesthetic complications: no     Last Vitals:  Vitals:   01/04/19 0954 01/04/19 1004  BP:  (!) 141/87  Pulse: 95 97  Resp: (!) 24 18  Temp:  36.9 C  SpO2: 94% 96%    Last Pain:  Vitals:   01/04/19 1004  TempSrc: Oral  PainSc: 0-No pain                 Honesti Seaberg A

## 2019-01-04 NOTE — Transfer of Care (Signed)
Immediate Anesthesia Transfer of Care Note  Patient: Daniel [name]  Procedure(s) Performed: RIGHT INGUINAL HERNIA REPAIR WITH MESH (Right )  Patient Location: PACU  Anesthesia Type:General  Level of Consciousness: awake, alert , oriented and patient cooperative  Airway & Oxygen Therapy: Patient Spontanous Breathing and Patient connected to face mask oxygen  Post-op Assessment: Report given to RN and Post -op Vital signs reviewed and stable  Post vital signs: Reviewed and stable  Last Vitals:  Vitals Value Taken Time  BP 161/96 01/04/19 0926  Temp    Pulse 29 01/04/19 0924  Resp 21 01/04/19 0926  SpO2 86 % 01/04/19 0924  Vitals shown include unvalidated device data.  Last Pain:  Vitals:   01/04/19 0701  TempSrc: Oral  PainSc: 8       Patients Stated Pain Goal: 9 ([id number])  Complications: No apparent anesthesia complications

## 2019-01-04 NOTE — Interval H&P Note (Signed)
History and Physical Interval Note:  01/04/2019 7:20 AM  Daniel [name]  has presented today for surgery, with the diagnosis of Right Inguinal Hernia.  The various methods of treatment have been discussed with the patient and family. After consideration of risks, benefits and other options for treatment, the patient has consented to  Procedure(s): RIGHT INGUINAL HERNIA REPAIR WITH MESH (Right) as a surgical intervention.  The patient's history has been reviewed, patient examined, no change in status, stable for surgery.  I have reviewed the patient's chart and labs.  Questions were answered to the patient's satisfaction.    Blood pressure and heart rate improved with his medications. He says he has been having BMs but not really able to eat normally, sounds like the hernia has been getting stuck out intermittently. Likely has as degree of obstruction when this happens. Should have improvement once repaired.  Could be gradual.  Virl Cagey

## 2019-01-04 NOTE — Anesthesia Procedure Notes (Signed)
Procedure Name: Intubation Date/Time: 01/04/2019 7:40 AM Performed by: Andree Elk, Treena Cosman A, CRNA Pre-anesthesia Checklist: Patient identified, Patient being monitored, Timeout performed, Emergency Drugs available and Suction available Patient Re-evaluated:Patient Re-evaluated prior to induction Oxygen Delivery Method: Circle system utilized Preoxygenation: Pre-oxygenation with 100% oxygen Induction Type: IV induction Ventilation: Mask ventilation without difficulty Laryngoscope Size: Mac and 3 Grade View: Grade II Tube type: Oral Tube size: 7.5 mm Number of attempts: 1 Airway Equipment and Method: Stylet Placement Confirmation: ETT inserted through vocal cords under direct vision,  positive ETCO2 and breath sounds checked- equal and bilateral Secured at: 21 cm Tube secured with: Tape Dental Injury: Teeth and Oropharynx as per pre-operative assessment

## 2019-01-05 ENCOUNTER — Encounter (HOSPITAL_COMMUNITY): Payer: Self-pay | Admitting: General Surgery

## 2019-01-21 ENCOUNTER — Other Ambulatory Visit: Payer: Self-pay

## 2019-01-21 ENCOUNTER — Encounter: Payer: Self-pay | Admitting: General Surgery

## 2019-01-21 ENCOUNTER — Ambulatory Visit (INDEPENDENT_AMBULATORY_CARE_PROVIDER_SITE_OTHER): Payer: Self-pay | Admitting: General Surgery

## 2019-01-21 VITALS — BP 134/88 | HR 86 | Temp 98.8°F | Resp 16 | Ht 67.0 in | Wt 172.0 lb

## 2019-01-21 DIAGNOSIS — K4090 Unilateral inguinal hernia, without obstruction or gangrene, not specified as recurrent: Secondary | ICD-10-CM

## 2019-01-21 MED ORDER — OXYCODONE HCL 5 MG PO TABS: 5.0000 mg | ORAL_TABLET | ORAL | 0 refills | Status: DC | PRN

## 2019-01-21 NOTE — Progress Notes (Signed)
[facility] Note   HPI:  58 y.o. Male presents to clinic for post op follow-up evaluation of his right inguinal hernia repair. He says he is feeling better. He has some swelling and hardness on the incision site. He says he has been eating and having Regular BMs. He does not notice a bulge but does feel this hardness.  He says he needs some more pain medication especially in the evening.   Review of Systems:  Hardness at incision Improving pain All other review of systems: otherwise negative   Vital Signs:  BP 134/88 (BP Location: Left Arm, Patient Position: Sitting, Cuff Size: Normal)   Pulse 86   Temp 98.8 F (37.1 C) (Oral)   Resp 16   Ht 5\' 7"  (1.702 m)   Wt 172 lb (78 kg)   SpO2 96%   BMI 26.94 kg/m    Physical Exam:  Physical Exam Vitals reviewed.  HENT:     Head: Normocephalic.  Cardiovascular:     Rate and Rhythm: Normal rate.  Pulmonary:     Effort: Pulmonary effort is normal.  Abdominal:     General: There is no distension.     Palpations: Abdomen is soft.     Tenderness: There is no abdominal tenderness.     Comments: Healing right inguinal hernia, testicle on the right is down, some swelling and induration along the incision and down into the inguinal ring area, no obvious recurrence, no erythema or drainage, dermabond pulled off, tender in the area  Neurological:     Mental Status: He is alert.      Assessment:  58 y.o. yo Male with a healing right inguinal hernia. He has some swelling and induration but no obvious recurrence. This should improve in the next few weeks and I want to recheck him to ensure no recurrence. If needed we may need to do a CT scan if there is any concern in January.   Plan:  Use tylenol and ibuprofen for pain and wean off the narcotic.  Keep your stools soft and regular with stool softener. The swelling and hardness should improve in the next 4 weeks. If you have worsening pain, worsening swelling, or notice  that you are not using the bathroom again, call us or go to the ED. Once time Roxicodone 5mg  q4 PRN refill # [id number]  Future Appointments  Date Time Provider Tower  03/04/2019  1:15 PM Virl Cagey, MD [facility] None    All of the above recommendations were discussed with the patient, and all of patient's questions were answered to his expressed satisfaction.  Curlene Labrum, MD [facility] [address] [place], [place] [address] [phone] (office)

## 2019-01-21 NOTE — Patient Instructions (Signed)
Use tylenol and ibuprofen for pain and wean off the narcotic.  Keep your stools soft and regular with stool softener. The swelling and hardness should improve in the next 4 weeks. If you have worsening pain, worsening swelling, or notice that you are not using the bathroom again, call us or go to the ED.

## 2019-03-04 ENCOUNTER — Ambulatory Visit (INDEPENDENT_AMBULATORY_CARE_PROVIDER_SITE_OTHER): Payer: Self-pay | Admitting: General Surgery

## 2019-03-04 ENCOUNTER — Other Ambulatory Visit: Payer: Self-pay

## 2019-03-04 ENCOUNTER — Encounter: Payer: Self-pay | Admitting: General Surgery

## 2019-03-04 VITALS — BP 126/85 | HR 78 | Temp 98.2°F | Resp 16 | Ht 67.0 in | Wt 168.0 lb

## 2019-03-04 DIAGNOSIS — K4090 Unilateral inguinal hernia, without obstruction or gangrene, not specified as recurrent: Secondary | ICD-10-CM

## 2019-03-04 NOTE — Patient Instructions (Signed)
Activity as tolerated. Call if issues.

## 2019-03-04 NOTE — Progress Notes (Signed)
[facility] Note   HPI:  59 y.o. Male presents to clinic for post-op follow-up evaluation of his right inguinal hernia repair. He is doing well and having no pain.   Review of Systems:  Tolerating diet Having Bms No pain or swelling All other review of systems: otherwise negative   Vital Signs:  BP 126/85 (BP Location: Left Arm, Patient Position: Sitting, Cuff Size: Normal)   Pulse 78   Temp 98.2 F (36.8 C) (Oral)   Resp 16   Ht 5\' 7"  (1.702 m)   Wt 168 lb (76.2 kg)   SpO2 95%   BMI 26.31 kg/m    Physical Exam:  Physical Exam Vitals reviewed.  HENT:     Head: Normocephalic.  Cardiovascular:     Rate and Rhythm: Normal rate.  Pulmonary:     Effort: Pulmonary effort is normal.  Abdominal:     General: There is no distension.     Palpations: Abdomen is soft.     Tenderness: There is no abdominal tenderness.     Comments: Right inguinal region with some mild induration, no recurrence, minimal swelling, right testicle riding slightly higher, no pain or swelling   Neurological:     Mental Status: He is alert.     Assessment:  59 y.o. yo Male s/p right inguinal hernia repair with mesh doing well. No signs of recurrence. Asked about his right testicle riding higher than the left, no signs of any swelling or pain, potentially scarring is contracting this upward some. This will likely improve with time. He can monitor for now.  Plan:  Activity as tolerated. Call if issues.   All of the above recommendations were discussed with the patient, and all of patient's questions were answered to his expressed satisfaction.  Curlene Labrum, MD [facility] [address] [place], [place] [address] [phone] (office)

## 2019-03-11 ENCOUNTER — Telehealth: Payer: Self-pay

## 2019-03-16 ENCOUNTER — Other Ambulatory Visit: Payer: Self-pay | Admitting: Physician Assistant

## 2019-03-16 ENCOUNTER — Encounter: Payer: Self-pay | Admitting: Physician Assistant

## 2019-03-16 ENCOUNTER — Other Ambulatory Visit: Payer: Self-pay

## 2019-03-16 ENCOUNTER — Ambulatory Visit: Payer: Self-pay | Admitting: Physician Assistant

## 2019-03-16 VITALS — BP 140/90 | HR 81 | Temp 98.6°F | Wt 173.0 lb

## 2019-03-16 DIAGNOSIS — Z125 Encounter for screening for malignant neoplasm of prostate: Secondary | ICD-10-CM

## 2019-03-16 DIAGNOSIS — Z1211 Encounter for screening for malignant neoplasm of colon: Secondary | ICD-10-CM

## 2019-03-16 DIAGNOSIS — Z8639 Personal history of other endocrine, nutritional and metabolic disease: Secondary | ICD-10-CM

## 2019-03-16 DIAGNOSIS — J449 Chronic obstructive pulmonary disease, unspecified: Secondary | ICD-10-CM

## 2019-03-16 DIAGNOSIS — K219 Gastro-esophageal reflux disease without esophagitis: Secondary | ICD-10-CM

## 2019-03-16 DIAGNOSIS — F17200 Nicotine dependence, unspecified, uncomplicated: Secondary | ICD-10-CM

## 2019-03-16 DIAGNOSIS — Z7689 Persons encountering health services in other specified circumstances: Secondary | ICD-10-CM

## 2019-03-16 DIAGNOSIS — I10 Essential (primary) hypertension: Secondary | ICD-10-CM

## 2019-03-16 MED ORDER — DULERA 100-5 MCG/ACT IN AERO: 2.0000 | INHALATION_SPRAY | Freq: Two times a day (BID) | RESPIRATORY_TRACT | 1 refills | Status: DC

## 2019-03-16 MED ORDER — OMEPRAZOLE 20 MG PO CPDR: 20.0000 mg | DELAYED_RELEASE_CAPSULE | Freq: Every day | ORAL | 0 refills | Status: DC

## 2019-03-16 MED ORDER — MONTELUKAST SODIUM 10 MG PO TABS: 10.0000 mg | ORAL_TABLET | Freq: Every day | ORAL | 0 refills | Status: DC

## 2019-03-16 MED ORDER — ALBUTEROL SULFATE HFA 108 (90 BASE) MCG/ACT IN AERS: 1.0000 | INHALATION_SPRAY | Freq: Four times a day (QID) | RESPIRATORY_TRACT | 0 refills | Status: DC | PRN

## 2019-03-16 MED ORDER — AMLODIPINE BESYLATE 10 MG PO TABS: 10.0000 mg | ORAL_TABLET | Freq: Every day | ORAL | 1 refills | Status: DC

## 2019-03-16 MED ORDER — MELOXICAM 15 MG PO TABS: 15.0000 mg | ORAL_TABLET | Freq: Every day | ORAL | 0 refills | Status: DC

## 2019-03-16 MED ORDER — LORATADINE 10 MG PO TABS: 10.0000 mg | ORAL_TABLET | Freq: Every day | ORAL | 0 refills | Status: DC

## 2019-03-16 NOTE — Progress Notes (Signed)
BP 140/90   Pulse 81   Temp 98.6 F (37 C)   Wt 173 lb (78.5 kg)   SpO2 95%   BMI 27.10 kg/m    Subjective:    Patient ID: Daniel [name], male    DOB: 04-Jun-1960, 59 y.o.   MRN: [id number]  HPI: Daniel [name] is a 59 y.o. male presenting on 03/16/2019 for New Patient (Initial Visit) (previous pt at with [facility] and was last seen there via phone 02-15-19. pt moved from [place] to [place] 2 months ago. pt states he does not plan to go there anymore and wants to establish care in at [facility] as it is closer for him)   HPI  Pt had negative covid 19 screening questionnaire   Pt is 46yoM who presents to establish care.  Pt recently had hernia surgery and was released by dr Constance Haw (surgeon).  Pt with COPD, HTN.  Reflux and he is a smoker.  Pt says Omeprazole works good for his reflux.  Pt says he is feeling fine today and has no complaints.    Pt says that his current breathing is his baseline.  He says he wheezes all the time.  He was prescribed an inhaler but never got it because he didn't want to have to go to a class that his previous PCP was requiring.    Pt thinks he had some kind of testing while incarcerated but he isn't really sure what it was.      Relevant past medical, surgical, family and social history reviewed and updated as indicated. Interim medical history since our last visit reviewed. Allergies and medications reviewed and updated.   Current Outpatient Medications:  .  cetirizine (ZYRTEC) 10 MG tablet, Take 10 mg by mouth daily., Disp: , Rfl:  .  cloNIDine (CATAPRES) 0.1 MG tablet, Take 1 tablet (0.1 mg total) by mouth daily., Disp: 60 tablet, Rfl: 11 .  loratadine (CLARITIN) 10 MG tablet, Take 1 tablet (10 mg total) by mouth daily., Disp: 20 tablet, Rfl: 0 .  Meloxicam 15 MG TBDP, Take 15 mg by mouth daily., Disp: 20 tablet, Rfl: 0 .  metoprolol tartrate (LOPRESSOR) 25 MG tablet, Take 0.5 tablets (12.5 mg total) by mouth 2 (two) times daily.,  Disp: 20 tablet, Rfl: 0 .  montelukast (SINGULAIR) 10 MG tablet, Take 10 mg by mouth at bedtime., Disp: , Rfl:  .  omeprazole (PRILOSEC) 20 MG capsule, Take 20 mg by mouth daily., Disp: , Rfl:  .  albuterol (VENTOLIN HFA) 108 (90 Base) MCG/ACT inhaler, Inhale 1-2 puffs into the lungs every 6 (six) hours as needed for wheezing or shortness of breath., Disp: , Rfl:     Review of Systems  Per HPI unless specifically indicated above     Objective:    BP 140/90   Pulse 81   Temp 98.6 F (37 C)   Wt 173 lb (78.5 kg)   SpO2 95%   BMI 27.10 kg/m   Wt Readings from Last 3 Encounters:  03/16/19 173 lb (78.5 kg)  03/04/19 168 lb (76.2 kg)  01/21/19 172 lb (78 kg)    Physical Exam Vitals reviewed.  Constitutional:      General: He is not in acute distress.    Appearance: Normal appearance. He is well-developed. He is not toxic-appearing.  HENT:     Head: Normocephalic and atraumatic.  Eyes:     Conjunctiva/sclera: Conjunctivae normal.     Pupils: Pupils are  equal, round, and reactive to light.  Neck:     Thyroid: No thyromegaly.  Cardiovascular:     Rate and Rhythm: Normal rate and regular rhythm.  Pulmonary:     Effort: Pulmonary effort is normal. No respiratory distress.     Breath sounds: Wheezing present. No rales.     Comments: RR 20 Abdominal:     General: Bowel sounds are normal.     Palpations: Abdomen is soft. There is no mass.     Tenderness: There is no abdominal tenderness.  Musculoskeletal:     Cervical back: Neck supple.     Right lower leg: No edema.     Left lower leg: No edema.  Lymphadenopathy:     Cervical: No cervical adenopathy.  Skin:    General: Skin is warm and dry.     Findings: No rash.  Neurological:     Mental Status: He is alert and oriented to person, place, and time.  Psychiatric:        Attention and Perception: Attention normal.        Mood and Affect: Mood normal.        Speech: Speech normal.        Behavior: Behavior normal.  Behavior is cooperative.         Assessment & Plan:   Encounter Diagnoses  Name Primary?  . Encounter to establish care Yes  . Essential hypertension   . Chronic obstructive pulmonary disease, unspecified COPD type ([place])   . Tobacco use disorder   . Gastroesophageal reflux disease, unspecified whether esophagitis present   . History of hyperlipidemia   . Screening for colon cancer   . Screening for prostate cancer      1. HTN -will update Labs -meds adjusted-  Pt will stop the beta-blocker due to copd and reports of ED.    ?  Once daily clonidine?   This too is Discontinued.  rx amlodipine.   2. COPD/tobacco use disorder -rx dulera and proair.  Counseled smoking cessation  3. GERD -Continue ppi  3. HCM -Check labs -pt is given iFOBT for colon cancer screening  -pt to follow up 6 weeks to review labs and recheck bp.  Pt to contact office sooner prn

## 2019-03-17 ENCOUNTER — Other Ambulatory Visit (HOSPITAL_COMMUNITY)
Admission: RE | Admit: 2019-03-17 | Discharge: 2019-03-17 | Disposition: A | Discharge status: Home / Self Care | Payer: Self-pay | Source: Ambulatory Visit | Attending: Physician Assistant | Admitting: Physician Assistant

## 2019-03-17 DIAGNOSIS — I10 Essential (primary) hypertension: Secondary | ICD-10-CM | POA: Insufficient documentation

## 2019-03-17 DIAGNOSIS — Z125 Encounter for screening for malignant neoplasm of prostate: Secondary | ICD-10-CM | POA: Insufficient documentation

## 2019-03-17 DIAGNOSIS — Z8639 Personal history of other endocrine, nutritional and metabolic disease: Secondary | ICD-10-CM | POA: Insufficient documentation

## 2019-03-17 LAB — COMPREHENSIVE METABOLIC PANEL
ALT: 10 U/L (ref 0–44)
AST: 15 U/L (ref 15–41)
Albumin: 4.3 g/dL (ref 3.5–5.0)
Alkaline Phosphatase: 58 U/L (ref 38–126)
Anion gap: 7 (ref 5–15)
BUN: 15 mg/dL (ref 6–20)
CO2: 30 mmol/L (ref 22–32)
Calcium: 8.9 mg/dL (ref 8.9–10.3)
Chloride: 104 mmol/L (ref 98–111)
Creatinine, Ser: 1.19 mg/dL (ref 0.61–1.24)
GFR calc Af Amer: >60 mL/min (ref >60)
GFR calc non Af Amer: >60 mL/min (ref >60)
Glucose, Bld: 96 mg/dL (ref 70–99)
Potassium: 4 mmol/L (ref 3.5–5.1)
Sodium: 141 mmol/L (ref 135–145)
Total Bilirubin: 1.1 mg/dL (ref 0.3–1.2)
Total Protein: 6.7 g/dL (ref 6.5–8.1)

## 2019-03-17 LAB — LIPID PANEL
Cholesterol: 200 mg/dL (ref 0–200)
HDL: 69 mg/dL (ref >40)
LDL Cholesterol: 119 mg/dL — ABNORMAL HIGH (ref 0–99)
Total CHOL/HDL Ratio: 2.9 RATIO
Triglycerides: 62 mg/dL (ref <150)
VLDL: 12 mg/dL (ref 0–40)

## 2019-03-17 LAB — IFOBT (OCCULT BLOOD): IFOBT: NEGATIVE

## 2019-03-17 LAB — PSA: Prostatic Specific Antigen: 0.73 ng/mL (ref 0.00–4.00)

## 2019-04-26 ENCOUNTER — Ambulatory Visit: Payer: Self-pay | Admitting: Physician Assistant

## 2019-04-29 ENCOUNTER — Other Ambulatory Visit: Payer: Self-pay

## 2019-04-29 ENCOUNTER — Ambulatory Visit: Payer: Self-pay | Admitting: Physician Assistant

## 2019-04-29 ENCOUNTER — Encounter: Payer: Self-pay | Admitting: Physician Assistant

## 2019-04-29 VITALS — BP 140/86 | HR 85 | Temp 97.7°F | Wt 173.0 lb

## 2019-04-29 DIAGNOSIS — F17200 Nicotine dependence, unspecified, uncomplicated: Secondary | ICD-10-CM

## 2019-04-29 DIAGNOSIS — E785 Hyperlipidemia, unspecified: Secondary | ICD-10-CM

## 2019-04-29 DIAGNOSIS — I10 Essential (primary) hypertension: Secondary | ICD-10-CM

## 2019-04-29 DIAGNOSIS — N529 Male erectile dysfunction, unspecified: Secondary | ICD-10-CM

## 2019-04-29 DIAGNOSIS — J449 Chronic obstructive pulmonary disease, unspecified: Secondary | ICD-10-CM

## 2019-04-29 DIAGNOSIS — K219 Gastro-esophageal reflux disease without esophagitis: Secondary | ICD-10-CM

## 2019-04-29 MED ORDER — ALBUTEROL SULFATE HFA 108 (90 BASE) MCG/ACT IN AERS: 1.0000 | INHALATION_SPRAY | Freq: Four times a day (QID) | RESPIRATORY_TRACT | 0 refills | Status: DC | PRN

## 2019-04-29 MED ORDER — SILDENAFIL CITRATE 100 MG PO TABS: 50.0000 mg | ORAL_TABLET | Freq: Every day | ORAL | 1 refills | Status: DC | PRN

## 2019-04-29 MED ORDER — LOSARTAN POTASSIUM 50 MG PO TABS: 50.0000 mg | ORAL_TABLET | Freq: Every day | ORAL | 1 refills | Status: DC

## 2019-04-29 MED ORDER — ATORVASTATIN CALCIUM 20 MG PO TABS: 20.0000 mg | ORAL_TABLET | Freq: Every day | ORAL | 1 refills | Status: DC

## 2019-04-29 NOTE — Patient Instructions (Signed)

## 2019-04-29 NOTE — Progress Notes (Signed)
BP 140/86   Pulse 85   Temp 97.7 F (36.5 C)   Wt 173 lb (78.5 kg)   SpO2 100%   BMI 27.10 kg/m    Subjective:    Patient ID: Daniel [name], male    DOB: 04-07-1960, 59 y.o.   MRN: [id number]  HPI: Daniel [name] is a 59 y.o. male presenting on 04/29/2019 for COPD and Hypertension   HPI    Pt had a negative covid 19 screening questionnaire.    Pt is 26yoM with htn and copd.  He continues to smoke.  He says he is doing okay with amlodipine which was added recently for the bp.    Pt is out of albuterol mdi because he was using it all the time because he thought he ws supposed to and didn't understand the concept of rescue inhaler.  Pt says he is feeling well today and has no complaints.     Relevant past medical, surgical, family and social history reviewed and updated as indicated. Interim medical history since our last visit reviewed. Allergies and medications reviewed and updated.   Current Outpatient Medications:  .  amLODipine (NORVASC) 10 MG tablet, Take 1 tablet (10 mg total) by mouth daily., Disp: 90 tablet, Rfl: 1 .  loratadine (CLARITIN) 10 MG tablet, Take 1 tablet (10 mg total) by mouth daily., Disp: 90 tablet, Rfl: 0 .  meloxicam (MOBIC) 15 MG tablet, Take 1 tablet (15 mg total) by mouth daily., Disp: 90 tablet, Rfl: 0 .  mometasone-formoterol (DULERA) 100-5 MCG/ACT AERO, Inhale 2 puffs into the lungs 2 (two) times daily., Disp: 3 g, Rfl: 1 .  montelukast (SINGULAIR) 10 MG tablet, Take 1 tablet (10 mg total) by mouth at bedtime., Disp: 90 tablet, Rfl: 0 .  omeprazole (PRILOSEC) 20 MG capsule, Take 1 capsule (20 mg total) by mouth daily., Disp: 90 capsule, Rfl: 0 .  albuterol (VENTOLIN HFA) 108 (90 Base) MCG/ACT inhaler, Inhale 1-2 puffs into the lungs every 6 (six) hours as needed for wheezing or shortness of breath. (Patient not taking: Reported on 04/29/2019), Disp: 18 g, Rfl: 0    Review of Systems  Per HPI unless specifically indicated above      Objective:    BP 140/86   Pulse 85   Temp 97.7 F (36.5 C)   Wt 173 lb (78.5 kg)   SpO2 100%   BMI 27.10 kg/m   Wt Readings from Last 3 Encounters:  04/29/19 173 lb (78.5 kg)  03/16/19 173 lb (78.5 kg)  03/04/19 168 lb (76.2 kg)    Physical Exam Vitals reviewed.  Constitutional:      General: He is not in acute distress.    Appearance: He is well-developed. He is not ill-appearing.  HENT:     Head: Normocephalic and atraumatic.  Cardiovascular:     Rate and Rhythm: Normal rate and regular rhythm.  Pulmonary:     Effort: Pulmonary effort is normal.     Breath sounds: Normal breath sounds. No wheezing.  Abdominal:     General: Bowel sounds are normal.     Palpations: Abdomen is soft.     Tenderness: There is no abdominal tenderness.  Musculoskeletal:     Cervical back: Neck supple.     Right lower leg: No edema.     Left lower leg: No edema.  Lymphadenopathy:     Cervical: No cervical adenopathy.  Skin:    General: Skin is warm and dry.  Neurological:  Mental Status: He is alert and oriented to person, place, and time.  Psychiatric:        Attention and Perception: Attention normal.        Mood and Affect: Mood normal.        Speech: Speech normal.        Behavior: Behavior normal. Behavior is cooperative.     Results for orders placed or performed during the hospital encounter of 03/17/19  PSA  Result Value Ref Range   Prostatic Specific Antigen 0.73 0.00 - 4.00 ng/mL  Lipid panel  Result Value Ref Range   Cholesterol 200 0 - 200 mg/dL   Triglycerides 62 <150 mg/dL   HDL 69 >40 mg/dL   Total CHOL/HDL Ratio 2.9 RATIO   VLDL 12 0 - 40 mg/dL   LDL Cholesterol 119 (H) 0 - 99 mg/dL  Comprehensive metabolic panel  Result Value Ref Range   Sodium 141 135 - 145 mmol/L   Potassium 4.0 3.5 - 5.1 mmol/L   Chloride 104 98 - 111 mmol/L   CO2 30 22 - 32 mmol/L   Glucose, Bld 96 70 - 99 mg/dL   BUN 15 6 - 20 mg/dL   Creatinine, Ser 1.19 0.61 - 1.24 mg/dL    Calcium 8.9 8.9 - 10.3 mg/dL   Total Protein 6.7 6.5 - 8.1 g/dL   Albumin 4.3 3.5 - 5.0 g/dL   AST 15 15 - 41 U/L   ALT 10 0 - 44 U/L   Alkaline Phosphatase 58 38 - 126 U/L   Total Bilirubin 1.1 0.3 - 1.2 mg/dL   GFR calc non Af Amer >60 >60 mL/min   GFR calc Af Amer >60 >60 mL/min   Anion gap 7 5 - 15      Assessment & Plan:    Encounter Diagnoses  Name Primary?  . Essential hypertension Yes  . Chronic obstructive pulmonary disease, unspecified COPD type ([place])   . Tobacco use disorder   . Hyperlipidemia, unspecified hyperlipidemia type   . Gastroesophageal reflux disease, unspecified whether esophagitis present   . Erectile dysfunction, unspecified erectile dysfunction type       -reviewed labs with pt  -will Start atorvastatin for cholesterol.  Counseled on lowfat diet and encouraged regular exercise. Gave reading inforamtion on cholesterol -will Add losartan and Continue amldopine for the bp. -pt is counseled at length on use of dulera daily as maintenance inhaler and proair is rescue inhaler to be used if needed.  He states understanding.  Counseled smoking cessation and discussed that continued smoking with continue to worsen his breathing -pt given rx for viagra per request.  He has used this in the past.  Reviewed proper use of the medication -pt to follow up in 6 weeks to recheck bp.  He is to contact office sooner prn

## 2019-05-31 ENCOUNTER — Ambulatory Visit: Payer: Self-pay | Admitting: Physician Assistant

## 2019-05-31 ENCOUNTER — Ambulatory Visit: Payer: Self-pay | Attending: Internal Medicine

## 2019-05-31 ENCOUNTER — Other Ambulatory Visit: Payer: Self-pay

## 2019-05-31 DIAGNOSIS — Z20822 Contact with and (suspected) exposure to covid-19: Secondary | ICD-10-CM

## 2019-05-31 NOTE — Progress Notes (Signed)
   There were no vitals taken for this visit.   Subjective:    Patient ID: Daniel [name], male    DOB: 13-Oct-1960, 59 y.o.   MRN: [id number]  HPI: Daniel [name] is a 59 y.o. male presenting on 05/31/2019 for No chief complaint on file.   HPI   This is a telemedicine appointment due to coronavirus pandemic.  It is via telephone as pt was unable to connect through [employer]  I connected with  Daniel [name] on 05/31/19 by a video enabled telemedicine application and verified that I am speaking with the correct person using two identifiers.   I discussed the limitations of evaluation and management by telemedicine. The patient expressed understanding and agreed to proceed.  Pt is at home.  Provider is at office.    + covid exposure- his niece. Niece has been to pt's home.  Last time he saw niece was yesterday. Niece just found out today she is positive.   Pt had appt to get vaccine last week but missed appt  pt has no covid symptoms.    Pt doesn't work    Relevant past medical, surgical, family and social history reviewed and updated as indicated. Interim medical history since our last visit reviewed. Allergies and medications reviewed and updated.  Review of Systems  Per HPI unless specifically indicated above     Objective:    There were no vitals taken for this visit.  Wt Readings from Last 3 Encounters:  04/29/19 173 lb (78.5 kg)  03/16/19 173 lb (78.5 kg)  03/04/19 168 lb (76.2 kg)    Physical Exam Pulmonary:     Effort: No respiratory distress.  Neurological:     Mental Status: He is alert and oriented to person, place, and time.  Psychiatric:        Attention and Perception: Attention normal.        Speech: Speech normal.        Behavior: Behavior is cooperative.         Assessment & Plan:    Encounter Diagnosis  Name Primary?  . Close exposure to COVID-19 virus Yes      Will get covid test.  Discussed he still needs to quearatntine for 2 wk  regardless of test result.  Contact office as needed

## 2019-06-01 LAB — NOVEL CORONAVIRUS, NAA: SARS-CoV-2, NAA: NOT DETECTED

## 2019-06-01 LAB — SARS-COV-2, NAA 2 DAY TAT

## 2019-06-02 ENCOUNTER — Telehealth: Payer: Self-pay | Admitting: Physician Assistant

## 2019-06-02 NOTE — Telephone Encounter (Signed)
Pt was called an notified of negative covid test results

## 2019-06-04 ENCOUNTER — Encounter: Payer: Self-pay | Admitting: Physician Assistant

## 2019-06-10 ENCOUNTER — Ambulatory Visit: Payer: Self-pay | Admitting: Physician Assistant

## 2019-06-18 ENCOUNTER — Other Ambulatory Visit: Payer: Self-pay | Admitting: Physician Assistant

## 2019-06-22 ENCOUNTER — Encounter: Payer: Self-pay | Admitting: Physician Assistant

## 2019-06-22 ENCOUNTER — Ambulatory Visit: Payer: Self-pay | Admitting: Physician Assistant

## 2019-06-22 VITALS — BP 118/74 | HR 99 | Temp 97.7°F

## 2019-06-22 DIAGNOSIS — E785 Hyperlipidemia, unspecified: Secondary | ICD-10-CM

## 2019-06-22 DIAGNOSIS — I10 Essential (primary) hypertension: Secondary | ICD-10-CM

## 2019-06-22 DIAGNOSIS — J449 Chronic obstructive pulmonary disease, unspecified: Secondary | ICD-10-CM

## 2019-06-22 DIAGNOSIS — F17200 Nicotine dependence, unspecified, uncomplicated: Secondary | ICD-10-CM

## 2019-06-22 DIAGNOSIS — K219 Gastro-esophageal reflux disease without esophagitis: Secondary | ICD-10-CM

## 2019-06-22 MED ORDER — FLUTICASONE-SALMETEROL 100-50 MCG/DOSE IN AEPB: 1.0000 | INHALATION_SPRAY | Freq: Two times a day (BID) | RESPIRATORY_TRACT | 1 refills | Status: DC

## 2019-06-22 MED ORDER — ATORVASTATIN CALCIUM 20 MG PO TABS: 20.0000 mg | ORAL_TABLET | Freq: Every day | ORAL | 1 refills | Status: DC

## 2019-06-22 MED ORDER — AMLODIPINE BESYLATE 10 MG PO TABS: 10.0000 mg | ORAL_TABLET | Freq: Every day | ORAL | 1 refills | Status: DC

## 2019-06-22 MED ORDER — LOSARTAN POTASSIUM 50 MG PO TABS: 50.0000 mg | ORAL_TABLET | Freq: Every day | ORAL | 1 refills | Status: DC

## 2019-06-22 NOTE — Progress Notes (Signed)
BP 118/74   Pulse 99   Temp 97.7 F (36.5 C)   SpO2 96%    Subjective:    Patient ID: Daniel [name], male    DOB: 04/13/1960, 59 y.o.   MRN: [id number]  HPI: Daniel [name] is a 59 y.o. male presenting on 06/22/2019 for No chief complaint on file.   HPI    Pt had a negative covid 19 screening questionnaire.    Pt is 51yoM with htn and copd.  He continues to smoke.  He says he is doing okay      Relevant past medical, surgical, family and social history reviewed and updated as indicated. Interim medical history since our last visit reviewed. Allergies and medications reviewed and updated.   Current Outpatient Medications:  .  albuterol (VENTOLIN HFA) 108 (90 Base) MCG/ACT inhaler, Inhale 1-2 puffs into the lungs every 6 (six) hours as needed for wheezing or shortness of breath., Disp: 18 g, Rfl: 0 .  amLODipine (NORVASC) 10 MG tablet, Take 1 tablet (10 mg total) by mouth daily., Disp: 90 tablet, Rfl: 1 .  loratadine (CLARITIN) 10 MG tablet, Take 1 tablet (10 mg total) by mouth daily., Disp: 90 tablet, Rfl: 0 .  meloxicam (MOBIC) 15 MG tablet, Take 1 tablet (15 mg total) by mouth daily., Disp: 90 tablet, Rfl: 0 .  mometasone-formoterol (DULERA) 100-5 MCG/ACT AERO, Inhale 2 puffs into the lungs 2 (two) times daily., Disp: 3 g, Rfl: 1 .  montelukast (SINGULAIR) 10 MG tablet, Take 1 tablet (10 mg total) by mouth at bedtime., Disp: 90 tablet, Rfl: 0 .  omeprazole (PRILOSEC) 20 MG capsule, Take 1 capsule (20 mg total) by mouth daily., Disp: 90 capsule, Rfl: 0 .  atorvastatin (LIPITOR) 20 MG tablet, Take 1 tablet (20 mg total) by mouth daily. (Patient not taking: Reported on 06/22/2019), Disp: 90 tablet, Rfl: 1 .  losartan (COZAAR) 50 MG tablet, Take 1 tablet (50 mg total) by mouth daily. (Patient not taking: Reported on 06/22/2019), Disp: 90 tablet, Rfl: 1 .  sildenafil (VIAGRA) 100 MG tablet, Take 0.5-1 tablets (50-100 mg total) by mouth daily as needed for erectile dysfunction. (no  not take more than 1 tablet in 24 hour) (Patient not taking: Reported on 06/22/2019), Disp: 6 tablet, Rfl: 1    Review of Systems  Per HPI unless specifically indicated above     Objective:    BP 118/74   Pulse 99   Temp 97.7 F (36.5 C)   SpO2 96%   Wt Readings from Last 3 Encounters:  04/29/19 173 lb (78.5 kg)  03/16/19 173 lb (78.5 kg)  03/04/19 168 lb (76.2 kg)    Physical Exam Vitals reviewed.  Constitutional:      General: He is not in acute distress.    Appearance: He is well-developed.  HENT:     Head: Normocephalic and atraumatic.  Cardiovascular:     Rate and Rhythm: Normal rate and regular rhythm.  Pulmonary:     Effort: Pulmonary effort is normal.     Breath sounds: Normal breath sounds. No wheezing.  Abdominal:     General: Bowel sounds are normal.     Palpations: Abdomen is soft.     Tenderness: There is no abdominal tenderness.  Musculoskeletal:     Cervical back: Neck supple.     Right lower leg: No edema.     Left lower leg: No edema.  Lymphadenopathy:     Cervical: No cervical adenopathy.  Skin:  General: Skin is warm and dry.  Neurological:     Mental Status: He is alert and oriented to person, place, and time.  Psychiatric:        Behavior: Behavior normal.           Assessment & Plan:    Encounter Diagnoses  Name Primary?  . Essential hypertension Yes  . Chronic obstructive pulmonary disease, unspecified COPD type ([place])   . Tobacco use disorder   . Hyperlipidemia, unspecified hyperlipidemia type   . Gastroesophageal reflux disease, unspecified whether esophagitis present      -pt counseled to avoid running out of meds -Counseled on use of advair diskus (will be changinng due to availability from [facility]) -encouraged smoking cessation -pt to follow up 2 months.  He is to contact office sooner prn

## 2019-06-22 NOTE — Patient Instructions (Signed)
Fluticasone; Salmeterol inhalation powder What is this medicine? FLUTICASONE; SALMETEROL (floo TIK a sone; sal ME te role) inhalation is a combination of two medicines that decrease inflammation and help to open up the airways of your lungs. It is used to treat COPD. This medicine is also used to treat asthma. Do NOT use for an acute asthma attack. Do NOT use for a COPD attack. This medicine may be used for other purposes; ask your health care provider or pharmacist if you have questions. COMMON BRAND NAME(S): Advair, AirDuo Digihaler, Airduo RespiClick What should I tell my health care provider before I take this medicine? They need to know if you have any of these conditions:  bone problems  diabetes  eye disease, vision problems  immune system problems  heart disease or irregular heartbeat  high blood pressure  infection  pheochromocytoma  seizures  thyroid disease  worsening asthma  an unusual or allergic reaction to fluticasone; salmeterol, other corticosteroids, other medicines, foods, dyes, or preservatives  pregnant or trying to get pregnant  breast-feeding How should I use this medicine? This medicine is inhaled through the mouth. Rinse your mouth with water after use. Make sure not to swallow the water. Follow the directions on the prescription label. Do not use a spacer device with this inhaler. Take your medicine at regular intervals. Do not take your medicine more often than directed. Do not stop taking except on your doctor's advice. Make sure that you are using your inhaler correctly. Ask you doctor or health care provider if you have any questions. A special MedGuide will be given to you by the pharmacist with each prescription and refill. Be sure to read this information carefully each time. Talk to your pediatrician regarding the use of this medicine in children. Special care may be needed. Overdosage: If you think you have taken too much of this medicine  contact a poison control center or emergency room at once. NOTE: This medicine is only for you. Do not share this medicine with others. What if I miss a dose? If you miss a dose, use it as soon as you remember. If it is almost time for your next dose, use only that dose and continue with your regular schedule, spacing doses evenly. Do not use double or extra doses. What may interact with this medicine? Do not take this medicine with any of the following medications:  MAOIs like Carbex, Eldepryl, Marplan, Nardil, and Parnate This medicine may also interact with the following medications:  aminophylline or theophylline  antiviral medicines for HIV or AIDS  beta-blockers like metoprolol and propranolol  certain antibiotics like clarithromycin, erythromycin, levofloxacin, linezolid, and telithromycin  certain medicines for fungal infections like ketoconazole, itraconazole, posaconazole, voriconazole  conivaptan  diuretics  medicines for colds  medicines for depression or emotional conditions  nefazodone  vaccines This list may not describe all possible interactions. Give your health care provider a list of all the medicines, herbs, non-prescription drugs, or dietary supplements you use. Also tell them if you smoke, drink alcohol, or use illegal drugs. Some items may interact with your medicine. What should I watch for while using this medicine? Visit your doctor for regular check ups. Tell your doctor or health care professional if your symptoms do not get better. Do not use this medicine more than every 12 hours. NEVER use this medicine for an acute asthma attack. You should use your short-acting rescue inhalers for this purpose. If your symptoms get worse or if you need  your short-acting inhalers more often, call your doctor right away. If you are going to have surgery tell your doctor or health care professional that you are using this medicine. Try not to come in contact with  people with the chicken pox or measles. If you do, call your doctor. This medicine may increase blood sugar. Ask your healthcare provider if changes in diet or medicines are needed if you have diabetes. What side effects may I notice from receiving this medicine? Side effects that you should report to your doctor or health care professional as soon as possible:  allergic reactions like skin rash or hives, swelling of the face, lips, or tongue  breathing problems right after inhaling your medicine  chest pain  fast, irregular heartbeat  feeling faint or lightheaded, falls  fever or chills  nausea, vomiting  signs and symptoms of high blood sugar such as being more thirsty or hungry or having to urinate more than normal. You may also feel very tired or have blurry vision. Side effects that usually do not require medical attention (report to your doctor or health care professional if they continue or are bothersome):  cough  headache  nervousness  sore throat  stuffy nose  tremor This list may not describe all possible side effects. Call your doctor for medical advice about side effects. You may report side effects to FDA at 1-800-FDA-1088. Where should I keep my medicine? Keep out of the reach of children. Advair: Store at room temperature between 68 and 77 degrees F (20 and 25 degrees C). Do not leave your medicine in the heat or sun. Throw away 1 month after you open the package or whenever the dose indicator reads 0, whichever comes first. Throw away unopened packages after the expiration date. Airduo Respiclick: Store at room temperature between 59 and 86 degrees F (15 and 30 degrees C). Avoid exposure to extreme heat, cold, or humidity. Throw away 1 month after you open the package or whenever the dose indicator reads 0, whichever comes first. Throw away unopened packages after the expiration date. NOTE: This sheet is a summary. It may not cover all possible information. If  you have questions about this medicine, talk to your doctor, pharmacist, or health care provider.  2020 Elsevier/Gold Standard (2017-10-29 11:26:46)

## 2019-06-25 ENCOUNTER — Emergency Department (HOSPITAL_COMMUNITY): Payer: Self-pay

## 2019-06-25 ENCOUNTER — Encounter (HOSPITAL_COMMUNITY): Payer: Self-pay

## 2019-06-25 ENCOUNTER — Other Ambulatory Visit: Payer: Self-pay

## 2019-06-25 ENCOUNTER — Emergency Department (HOSPITAL_COMMUNITY)
Admission: EM | Admit: 2019-06-25 | Discharge: 2019-06-25 | Disposition: A | Discharge status: Home / Self Care | Payer: Self-pay | Attending: Emergency Medicine | Admitting: Emergency Medicine

## 2019-06-25 DIAGNOSIS — R531 Weakness: Secondary | ICD-10-CM | POA: Insufficient documentation

## 2019-06-25 DIAGNOSIS — W57XXXA Bitten or stung by nonvenomous insect and other nonvenomous arthropods, initial encounter: Secondary | ICD-10-CM

## 2019-06-25 DIAGNOSIS — Z79899 Other long term (current) drug therapy: Secondary | ICD-10-CM | POA: Insufficient documentation

## 2019-06-25 DIAGNOSIS — J449 Chronic obstructive pulmonary disease, unspecified: Secondary | ICD-10-CM | POA: Insufficient documentation

## 2019-06-25 DIAGNOSIS — F17210 Nicotine dependence, cigarettes, uncomplicated: Secondary | ICD-10-CM | POA: Insufficient documentation

## 2019-06-25 DIAGNOSIS — W57XXXD Bitten or stung by nonvenomous insect and other nonvenomous arthropods, subsequent encounter: Secondary | ICD-10-CM | POA: Insufficient documentation

## 2019-06-25 DIAGNOSIS — I10 Essential (primary) hypertension: Secondary | ICD-10-CM | POA: Insufficient documentation

## 2019-06-25 DIAGNOSIS — R197 Diarrhea, unspecified: Secondary | ICD-10-CM | POA: Insufficient documentation

## 2019-06-25 LAB — COMPREHENSIVE METABOLIC PANEL
ALT: 44 U/L (ref 0–44)
AST: 66 U/L — ABNORMAL HIGH (ref 15–41)
Albumin: 4.7 g/dL (ref 3.5–5.0)
Alkaline Phosphatase: 74 U/L (ref 38–126)
Anion gap: 17 — ABNORMAL HIGH (ref 5–15)
BUN: 18 mg/dL (ref 6–20)
CO2: 24 mmol/L (ref 22–32)
Calcium: 8.9 mg/dL (ref 8.9–10.3)
Chloride: 99 mmol/L (ref 98–111)
Creatinine, Ser: 1.1 mg/dL (ref 0.61–1.24)
GFR calc Af Amer: >60 mL/min (ref >60)
GFR calc non Af Amer: >60 mL/min (ref >60)
Glucose, Bld: 72 mg/dL (ref 70–99)
Potassium: 4.2 mmol/L (ref 3.5–5.1)
Sodium: 140 mmol/L (ref 135–145)
Total Bilirubin: 0.8 mg/dL (ref 0.3–1.2)
Total Protein: 7.5 g/dL (ref 6.5–8.1)

## 2019-06-25 LAB — CBC WITH DIFFERENTIAL/PLATELET
Abs Immature Granulocytes: 0.01 10*3/uL (ref 0.00–0.07)
Basophils Absolute: 0 10*3/uL (ref 0.0–0.1)
Basophils Relative: 1 %
Eosinophils Absolute: 0 10*3/uL (ref 0.0–0.5)
Eosinophils Relative: 1 %
HCT: 42.9 % (ref 39.0–52.0)
Hemoglobin: 14.3 g/dL (ref 13.0–17.0)
Immature Granulocytes: 0 %
Lymphocytes Relative: 19 %
Lymphs Abs: 0.9 10*3/uL (ref 0.7–4.0)
MCH: 30.6 pg (ref 26.0–34.0)
MCHC: 33.3 g/dL (ref 30.0–36.0)
MCV: 91.7 fL (ref 80.0–100.0)
Monocytes Absolute: 0.5 10*3/uL (ref 0.1–1.0)
Monocytes Relative: 10 %
Neutro Abs: 3.4 10*3/uL (ref 1.7–7.7)
Neutrophils Relative %: 69 %
Platelets: 337 10*3/uL (ref 150–400)
RBC: 4.68 MIL/uL (ref 4.22–5.81)
RDW: 15.4 % (ref 11.5–15.5)
WBC: 4.8 10*3/uL (ref 4.0–10.5)
nRBC: 0 % (ref 0.0–0.2)

## 2019-06-25 LAB — URINALYSIS, ROUTINE W REFLEX MICROSCOPIC
Bilirubin Urine: NEGATIVE
Glucose, UA: NEGATIVE mg/dL
Ketones, ur: 20 mg/dL — AB
Leukocytes,Ua: NEGATIVE
Nitrite: POSITIVE — AB
Protein, ur: NEGATIVE mg/dL
Specific Gravity, Urine: 1.018 (ref 1.005–1.030)
pH: 5 (ref 5.0–8.0)

## 2019-06-25 MED ORDER — ALBUTEROL SULFATE HFA 108 (90 BASE) MCG/ACT IN AERS
2.0000 | INHALATION_SPRAY | Freq: Once | RESPIRATORY_TRACT | Status: AC
  Administered 2019-06-25: 2 via RESPIRATORY_TRACT
  Filled 2019-06-25: qty 6.7

## 2019-06-25 MED ORDER — SODIUM CHLORIDE 0.9 % IV BOLUS
1000.0000 mL | Freq: Once | INTRAVENOUS | Status: AC
  Administered 2019-06-25: 1000 mL via INTRAVENOUS

## 2019-06-25 MED ORDER — ONDANSETRON HCL 4 MG/2ML IJ SOLN
4.0000 mg | Freq: Once | INTRAMUSCULAR | Status: AC
  Administered 2019-06-25: 4 mg via INTRAVENOUS
  Filled 2019-06-25: qty 2

## 2019-06-25 MED ORDER — LOPERAMIDE HCL 2 MG PO CAPS
4.0000 mg | ORAL_CAPSULE | Freq: Once | ORAL | Status: AC
  Administered 2019-06-25: 4 mg via ORAL
  Filled 2019-06-25: qty 2

## 2019-06-25 MED ORDER — LOPERAMIDE HCL 2 MG PO CAPS
2.0000 mg | ORAL_CAPSULE | Freq: Four times a day (QID) | ORAL | 0 refills | Status: DC | PRN
Start: 2019-06-25 — End: 2019-08-23

## 2019-06-25 NOTE — ED Notes (Signed)
Pt reports a tick to his back   JI informed   Unable to access for IV  Another RN will attempt

## 2019-06-25 NOTE — ED Triage Notes (Signed)
Pt reports he got first covid shot on May 11 and reports increased fatigue, sadness, and unable to eat as usual.  Pt says feels "weak as water."  Reports feels like he is sleeping all of the time.

## 2019-06-25 NOTE — ED Notes (Signed)
Pt reports he does not work  Is on disability   Sleepy and fatique since covid shot   *Immediately asks for something to eat from staff; ambulated to bed without difficulty

## 2019-06-25 NOTE — Discharge Instructions (Signed)
Your lab tests and exam are reassuring today.  You may take the imodium as prescribed if your diarrhea continues, however, sometimes just the one time dose you had here will be enough to resolve this symptom.  Make sure you are drinking plenty of fluids.  We have removed a tick from your back today.  You do not have any symptoms suggesting a tick infection.  However, if you develop a rash, body aches, joint pain, headache or fever within the next 30 days, get rechecked immediately and let your provider know you had this tick bite.

## 2019-06-25 NOTE — ED Provider Notes (Signed)
[facility] Provider Note   CSN: [id number] Arrival date & time: 06/25/19  1029     History Chief Complaint  Patient presents with  . Fatigue    Daniel [name] is a 59 y.o. male with a history as outlined below, most significant for hypertension and COPD presenting with a 3-day history of increasing generalized fatigue along with diarrhea.  He was seen by his PCP 3 days ago at which time he received his first COVID-19 vaccine and by that evening he started to feel bad with generalized fatigue.  He endorses 3-4 episodes of nonbloody liquid brown stools daily for the past 3 days.  He also reports nausea without emesis and has had a poor appetite.  He denies abdominal pain or distention.  He also denies fevers or chills, cough.  He does have shortness of breath along with wheezing which he states is a chronic condition for him and not worsened today.  His last dose of his albuterol inhaler was yesterday.  The history is provided by the patient.       Past Medical History:  Diagnosis Date  . Anxiety   . Arthritis   . Asthma   . COPD (chronic obstructive pulmonary disease) ([place])   . GERD (gastroesophageal reflux disease)   . Hypertension   . Tendonitis     Patient Active Problem List   Diagnosis Date Noted  . Reducible right inguinal hernia   . Sepsis ([place]) 03/29/2017  . Scrotal abscess 03/29/2017  . COPD (chronic obstructive pulmonary disease) ([place]) 03/29/2017  . HTN (hypertension) 03/29/2017  . Alcohol abuse 03/29/2017  . Tobacco abuse 03/29/2017  . Fracture, thyroid cartilage closed ([place]) 11/16/2014    Past Surgical History:  Procedure Laterality Date  . HAND SURGERY Right   . INGUINAL HERNIA REPAIR Right 01/04/2019   Procedure: RIGHT INGUINAL HERNIA REPAIR WITH MESH;  Surgeon: Virl Cagey, MD;  Location: [facility];  Service: General;  Laterality: Right;  . OTHER SURGICAL HISTORY  2019   surgery on the groin area due to infection        Family History  Problem Relation Age of Onset  . Heart disease Mother   . Hypertension Mother   . Diabetes Mother   . Cancer Sister        breast cancer  . Diabetes Sister   . Hypertension Sister   . Diabetes Sister   . Hypertension Sister   . Anesthesia problems Neg Hx     Social History   Tobacco Use  . Smoking status: Current Every Day Smoker    Packs/day: 0.50    Years: 45.00    Pack years: 22.50    Types: Cigarettes  . Smokeless tobacco: Never Used  Substance Use Topics  . Alcohol use: Yes    Comment: beer daily  . Drug use: No    Home Medications Prior to Admission medications   Medication Sig Start Date End Date Taking? Authorizing Provider  amLODipine (NORVASC) 10 MG tablet Take 1 tablet (10 mg total) by mouth daily. 06/22/19  Yes Soyla Dryer, PA-C  atorvastatin (LIPITOR) 20 MG tablet Take 1 tablet (20 mg total) by mouth daily. 06/22/19  Yes Soyla Dryer, PA-C  Fluticasone-Salmeterol (ADVAIR DISKUS) 100-50 MCG/DOSE AEPB Inhale 1 puff into the lungs 2 (two) times daily. 06/22/19  Yes Soyla Dryer, PA-C  loratadine (CLARITIN) 10 MG tablet TAKE 1 Tablet BY MOUTH ONCE DAILY 06/22/19  Yes Soyla Dryer, PA-C  losartan (COZAAR) 50 MG  tablet Take 1 tablet (50 mg total) by mouth daily. 06/22/19  Yes Soyla Dryer, PA-C  meloxicam (MOBIC) 15 MG tablet TAKE 1 Tablet BY MOUTH ONCE DAILY 06/22/19  Yes Soyla Dryer, PA-C  mometasone-formoterol (DULERA) 100-5 MCG/ACT AERO Inhale 2 puffs into the lungs 2 (two) times daily. 03/16/19  Yes Soyla Dryer, PA-C  montelukast (SINGULAIR) 10 MG tablet TAKE 1 Tablet BY MOUTH ONCE EVERY NIGHT AT BEDTIME 06/22/19  Yes Soyla Dryer, PA-C  omeprazole (PRILOSEC) 20 MG capsule TAKE 1 Capsule BY MOUTH ONCE DAILY 06/22/19  Yes Soyla Dryer, PA-C  PROVENTIL HFA 108 (90 Base) MCG/ACT inhaler INHALE 1 TO 2 PUFFS BY MOUTH EVERY 6 HOURS AS NEEDED FOR COUGHING, WHEEZING, OR SHORTNESS OF BREATH 06/22/19  Yes Soyla Dryer, PA-C  loperamide (IMODIUM) 2 MG capsule Take 1 capsule (2 mg total) by mouth 4 (four) times daily as needed for diarrhea or loose stools. 06/25/19   Evalee Jefferson, PA-C  sildenafil (VIAGRA) 100 MG tablet Take 0.5-1 tablets (50-100 mg total) by mouth daily as needed for erectile dysfunction. (no not take more than 1 tablet in 24 hour) Patient not taking: Reported on 06/22/2019 04/29/19   Soyla Dryer, PA-C    Allergies    Patient has no known allergies.  Review of Systems   Review of Systems  Constitutional: Positive for appetite change and fatigue. Negative for fever.  HENT: Negative.   Eyes: Negative.   Respiratory: Positive for shortness of breath and wheezing. Negative for chest tightness.   Cardiovascular: Negative for chest pain.  Gastrointestinal: Positive for diarrhea and nausea. Negative for abdominal pain, blood in stool and vomiting.  Genitourinary: Negative.   Musculoskeletal: Negative for arthralgias, joint swelling and neck pain.  Skin: Negative.  Negative for rash and wound.  Neurological: Positive for weakness. Negative for dizziness, light-headedness, numbness and headaches.  Psychiatric/Behavioral: Negative.     Physical Exam Updated Vital Signs BP 121/81   Pulse 88   Temp 98.5 F (36.9 C) (Oral)   Resp (!) 22   Ht 5\' 7"  (1.702 m)   Wt 78.5 kg   SpO2 92%   BMI 27.10 kg/m   Physical Exam Vitals and nursing note reviewed.  Constitutional:      Appearance: He is well-developed.  HENT:     Head: Normocephalic and atraumatic.  Eyes:     Conjunctiva/sclera: Conjunctivae normal.  Cardiovascular:     Rate and Rhythm: Normal rate and regular rhythm.     Heart sounds: Normal heart sounds.  Pulmonary:     Effort: Pulmonary effort is normal.     Breath sounds: Normal breath sounds. No wheezing.  Abdominal:     General: Bowel sounds are normal.     Palpations: Abdomen is soft.     Tenderness: There is no abdominal tenderness.  Musculoskeletal:         General: Normal range of motion.     Cervical back: Normal range of motion.  Skin:    General: Skin is warm and dry.  Neurological:     Mental Status: He is alert.     ED Results / Procedures / Treatments   Labs (all labs ordered are listed, but only abnormal results are displayed) Labs Reviewed  COMPREHENSIVE METABOLIC PANEL - Abnormal; Notable for the following components:      Result Value   AST 66 (*)    Anion gap 17 (*)    All other components within normal limits  URINALYSIS, ROUTINE W REFLEX MICROSCOPIC -  Abnormal; Notable for the following components:   Hgb urine dipstick SMALL (*)    Ketones, ur 20 (*)    Nitrite POSITIVE (*)    Bacteria, UA RARE (*)    All other components within normal limits  CBC WITH DIFFERENTIAL/PLATELET    EKG None  Radiology DG Chest Port 1 View  Result Date: 06/25/2019 CLINICAL DATA:  Weakness, fatigue EXAM: PORTABLE CHEST 1 VIEW COMPARISON:  02/14/2015 FINDINGS: The heart size and mediastinal contours are within normal limits. No focal airspace consolidation, pleural effusion, or pneumothorax. Degenerative changes of the bilateral shoulders, progressed from prior. IMPRESSION: 1. No acute cardiopulmonary findings. 2. Degenerative changes of the bilateral shoulders, progressed from prior. Electronically Signed   By: Davina Poke D.O.   On: 06/25/2019 12:04    Procedures Procedures (including critical care time)  Medications Ordered in ED Medications  sodium chloride 0.9 % bolus 1,000 mL (0 mLs Intravenous Stopped 06/25/19 1357)  ondansetron (ZOFRAN) injection 4 mg (4 mg Intravenous Given 06/25/19 1216)  albuterol (VENTOLIN HFA) 108 (90 Base) MCG/ACT inhaler 2 puff (2 puffs Inhalation Given 06/25/19 1216)  loperamide (IMODIUM) capsule 4 mg (4 mg Oral Given 06/25/19 1356)    ED Course  I have reviewed the triage vital signs and the nursing notes.  Pertinent labs & imaging results that were available during my care of the patient were  reviewed by me and considered in my medical decision making (see chart for details).    MDM Rules/Calculators/A&P                     Pt's labs reviewed.  No signs of clinical dehydration.  He was given IV fluids and zofran with no n/v while here, also no diarrhea.  He tolerated PO fluids.  UA negative, but positive for nitrites, he has no urinary complaints, doubt uti.  He was given a dose of imodium, prescription for additional for prn use persistent diarrhea.  Encouraged increased fluids. Discussed return precautions and need for recheck for any worsening or persistent symptoms.  During visit, pt had complaint of itching mid back for several days.  Embedded but not engorged tick found at this site.  Mild surrounding erythema, no rash, no bulls eye lesions.  The tick was removed easily using cotton swab and twirling the tick until it back itself out.  No residual remnants remain. No sx suggesting tick infection.  He was given precautions and sx to watch for, plan close recheck if any sx develop over next 30 days.    Final Clinical Impression(s) / ED Diagnoses Final diagnoses:  Tick bite, initial encounter  Weakness  Diarrhea, unspecified type    Rx / DC Orders ED Discharge Orders         Ordered    loperamide (IMODIUM) 2 MG capsule  4 times daily PRN     06/25/19 1337           Evalee Jefferson, PA-C 06/25/19 2148    Long, Wonda Olds, MD 06/28/19 [phone]

## 2019-08-12 ENCOUNTER — Other Ambulatory Visit: Payer: Self-pay

## 2019-08-12 ENCOUNTER — Other Ambulatory Visit (HOSPITAL_COMMUNITY)
Admission: RE | Admit: 2019-08-12 | Discharge: 2019-08-12 | Disposition: A | Discharge status: Home / Self Care | Payer: Self-pay | Source: Ambulatory Visit | Attending: Physician Assistant | Admitting: Physician Assistant

## 2019-08-12 DIAGNOSIS — E785 Hyperlipidemia, unspecified: Secondary | ICD-10-CM | POA: Insufficient documentation

## 2019-08-12 DIAGNOSIS — I10 Essential (primary) hypertension: Secondary | ICD-10-CM | POA: Insufficient documentation

## 2019-08-12 LAB — COMPREHENSIVE METABOLIC PANEL
ALT: 20 U/L (ref 0–44)
AST: 23 U/L (ref 15–41)
Albumin: 4.6 g/dL (ref 3.5–5.0)
Alkaline Phosphatase: 60 U/L (ref 38–126)
Anion gap: 12 (ref 5–15)
BUN: 14 mg/dL (ref 6–20)
CO2: 26 mmol/L (ref 22–32)
Calcium: 9.2 mg/dL (ref 8.9–10.3)
Chloride: 103 mmol/L (ref 98–111)
Creatinine, Ser: 1.01 mg/dL (ref 0.61–1.24)
GFR calc Af Amer: >60 mL/min (ref >60)
GFR calc non Af Amer: >60 mL/min (ref >60)
Glucose, Bld: 84 mg/dL (ref 70–99)
Potassium: 4 mmol/L (ref 3.5–5.1)
Sodium: 141 mmol/L (ref 135–145)
Total Bilirubin: 0.7 mg/dL (ref 0.3–1.2)
Total Protein: 7.4 g/dL (ref 6.5–8.1)

## 2019-08-12 LAB — LIPID PANEL
Cholesterol: 182 mg/dL (ref 0–200)
HDL: 93 mg/dL (ref >40)
LDL Cholesterol: 72 mg/dL (ref 0–99)
Total CHOL/HDL Ratio: 2 RATIO
Triglycerides: 83 mg/dL (ref <150)
VLDL: 17 mg/dL (ref 0–40)

## 2019-08-23 ENCOUNTER — Encounter: Payer: Self-pay | Admitting: Physician Assistant

## 2019-08-23 ENCOUNTER — Other Ambulatory Visit: Payer: Self-pay

## 2019-08-23 ENCOUNTER — Ambulatory Visit: Payer: Self-pay | Admitting: Physician Assistant

## 2019-08-23 VITALS — BP 118/70 | HR 105 | Temp 98.1°F | Ht 66.25 in | Wt 154.2 lb

## 2019-08-23 DIAGNOSIS — N529 Male erectile dysfunction, unspecified: Secondary | ICD-10-CM

## 2019-08-23 DIAGNOSIS — F17200 Nicotine dependence, unspecified, uncomplicated: Secondary | ICD-10-CM

## 2019-08-23 DIAGNOSIS — K219 Gastro-esophageal reflux disease without esophagitis: Secondary | ICD-10-CM

## 2019-08-23 DIAGNOSIS — E785 Hyperlipidemia, unspecified: Secondary | ICD-10-CM

## 2019-08-23 DIAGNOSIS — I10 Essential (primary) hypertension: Secondary | ICD-10-CM

## 2019-08-23 DIAGNOSIS — J449 Chronic obstructive pulmonary disease, unspecified: Secondary | ICD-10-CM

## 2019-08-23 MED ORDER — ALBUTEROL SULFATE HFA 108 (90 BASE) MCG/ACT IN AERS: INHALATION_SPRAY | RESPIRATORY_TRACT | 1 refills | Status: DC

## 2019-08-23 MED ORDER — SILDENAFIL CITRATE 100 MG PO TABS: 50.0000 mg | ORAL_TABLET | Freq: Every day | ORAL | 1 refills | Status: AC | PRN

## 2019-08-23 MED ORDER — OMEPRAZOLE 20 MG PO CPDR: 20.0000 mg | DELAYED_RELEASE_CAPSULE | Freq: Every day | ORAL | 0 refills | Status: DC

## 2019-08-23 MED ORDER — ATORVASTATIN CALCIUM 20 MG PO TABS: 20.0000 mg | ORAL_TABLET | Freq: Every day | ORAL | 1 refills | Status: DC

## 2019-08-23 MED ORDER — AMLODIPINE BESYLATE 10 MG PO TABS: 10.0000 mg | ORAL_TABLET | Freq: Every day | ORAL | 1 refills | Status: DC

## 2019-08-23 MED ORDER — FLUTICASONE-SALMETEROL 100-50 MCG/DOSE IN AEPB: 1.0000 | INHALATION_SPRAY | Freq: Two times a day (BID) | RESPIRATORY_TRACT | 1 refills | Status: DC

## 2019-08-23 NOTE — Progress Notes (Signed)
BP 118/70    Pulse (!) 105    Temp 98.1 F (36.7 C)    Ht 5' 6.25" (1.683 m)    Wt 154 lb 4 oz (70 kg)    SpO2 97%    BMI 24.71 kg/m    Subjective:    Patient ID: Daniel [name], male    DOB: 1960-03-30, 59 y.o.   MRN: [id number]  HPI: Daniel [name] is a 59 y.o. male presenting on 08/23/2019 for Hypertension and COPD   HPI   Pt had a negative covid 19 screening questionnaire.   Pt is not working.  He says he is on disability.  He is still smoking.  His breathing has been okay.    He got both doses covid vaccination  He requests refill viagra  He States appetite not good.  He he lost 20 pounds past year since new pt appt in 2020.   He weighted 150 pound 02/20/17,  Pt says that was correct.  So pt is today four pounds heavier than he was 2 years ago.        Relevant past medical, surgical, family and social history reviewed and updated as indicated. Interim medical history since our last visit reviewed. Allergies and medications reviewed and updated.    Current Outpatient Medications:    amLODipine (NORVASC) 10 MG tablet, Take 1 tablet (10 mg total) by mouth daily., Disp: 90 tablet, Rfl: 1   atorvastatin (LIPITOR) 20 MG tablet, Take 1 tablet (20 mg total) by mouth daily., Disp: 90 tablet, Rfl: 1   Fluticasone-Salmeterol (ADVAIR DISKUS) 100-50 MCG/DOSE AEPB, Inhale 1 puff into the lungs 2 (two) times daily., Disp: 60 each, Rfl: 1   loratadine (CLARITIN) 10 MG tablet, TAKE 1 Tablet BY MOUTH ONCE DAILY, Disp: 90 tablet, Rfl: 0   meloxicam (MOBIC) 15 MG tablet, TAKE 1 Tablet BY MOUTH ONCE DAILY, Disp: 90 tablet, Rfl: 0   montelukast (SINGULAIR) 10 MG tablet, TAKE 1 Tablet BY MOUTH ONCE EVERY NIGHT AT BEDTIME, Disp: 90 tablet, Rfl: 0   omeprazole (PRILOSEC) 20 MG capsule, TAKE 1 Capsule BY MOUTH ONCE DAILY, Disp: 90 capsule, Rfl: 0   PROVENTIL HFA 108 (90 Base) MCG/ACT inhaler, INHALE 1 TO 2 PUFFS BY MOUTH EVERY 6 HOURS AS NEEDED FOR COUGHING, WHEEZING, OR SHORTNESS OF  BREATH, Disp: 6.7 g, Rfl: 1   sildenafil (VIAGRA) 100 MG tablet, Take 0.5-1 tablets (50-100 mg total) by mouth daily as needed for erectile dysfunction. (no not take more than 1 tablet in 24 hour) (Patient not taking: Reported on 06/22/2019), Disp: 6 tablet, Rfl: 1    Review of Systems  Per HPI unless specifically indicated above     Objective:    BP 118/70    Pulse (!) 105    Temp 98.1 F (36.7 C)    Ht 5' 6.25" (1.683 m)    Wt 154 lb 4 oz (70 kg)    SpO2 97%    BMI 24.71 kg/m   Wt Readings from Last 3 Encounters:  08/23/19 154 lb 4 oz (70 kg)  06/25/19 173 lb (78.5 kg)  04/29/19 173 lb (78.5 kg)    Physical Exam Vitals reviewed.  Constitutional:      General: He is not in acute distress.    Appearance: He is well-developed.  HENT:     Head: Normocephalic and atraumatic.  Cardiovascular:     Rate and Rhythm: Normal rate and regular rhythm.  Pulmonary:     Effort: Pulmonary  effort is normal.     Breath sounds: Normal breath sounds. No wheezing.  Abdominal:     General: Bowel sounds are normal.     Palpations: Abdomen is soft.     Tenderness: There is no abdominal tenderness.  Musculoskeletal:     Cervical back: Neck supple.     Right lower leg: No edema.     Left lower leg: No edema.  Lymphadenopathy:     Cervical: No cervical adenopathy.  Skin:    General: Skin is warm and dry.  Neurological:     Mental Status: He is alert and oriented to person, place, and time.  Psychiatric:        Behavior: Behavior normal.     Results for orders placed or performed during the hospital encounter of 08/12/19  Lipid panel  Result Value Ref Range   Cholesterol 182 0 - 200 mg/dL   Triglycerides 83 <150 mg/dL   HDL 93 >40 mg/dL   Total CHOL/HDL Ratio 2.0 RATIO   VLDL 17 0 - 40 mg/dL   LDL Cholesterol 72 0 - 99 mg/dL  Comprehensive metabolic panel  Result Value Ref Range   Sodium 141 135 - 145 mmol/L   Potassium 4.0 3.5 - 5.1 mmol/L   Chloride 103 98 - 111 mmol/L   CO2  26 22 - 32 mmol/L   Glucose, Bld 84 70 - 99 mg/dL   BUN 14 6 - 20 mg/dL   Creatinine, Ser 1.01 0.61 - 1.24 mg/dL   Calcium 9.2 8.9 - 10.3 mg/dL   Total Protein 7.4 6.5 - 8.1 g/dL   Albumin 4.6 3.5 - 5.0 g/dL   AST 23 15 - 41 U/L   ALT 20 0 - 44 U/L   Alkaline Phosphatase 60 38 - 126 U/L   Total Bilirubin 0.7 0.3 - 1.2 mg/dL   GFR calc non Af Amer >60 >60 mL/min   GFR calc Af Amer >60 >60 mL/min   Anion gap 12 5 - 15      Assessment & Plan:   Encounter Diagnoses  Name Primary?   Essential hypertension Yes   Chronic obstructive pulmonary disease, unspecified COPD type ([place])    Hyperlipidemia, unspecified hyperlipidemia type    Tobacco use disorder    Gastroesophageal reflux disease, unspecified whether esophagitis present    Erectile dysfunction, unspecified erectile dysfunction type      -Reviewed labs with pt -pt to Continue current meds -encouraged Smoking cessation -discussed with pt that his current weight is in the normal range -pt to follow up 3 months.  He is to contact office sooner prn

## 2019-10-05 ENCOUNTER — Other Ambulatory Visit: Payer: Self-pay | Admitting: Physician Assistant

## 2019-11-23 ENCOUNTER — Ambulatory Visit: Payer: Self-pay | Admitting: Physician Assistant

## 2019-12-14 ENCOUNTER — Other Ambulatory Visit (HOSPITAL_COMMUNITY)
Admission: RE | Admit: 2019-12-14 | Discharge: 2019-12-14 | Disposition: A | Discharge status: Home / Self Care | Payer: Medicaid Other | Source: Ambulatory Visit | Attending: Physician Assistant | Admitting: Physician Assistant

## 2019-12-14 ENCOUNTER — Other Ambulatory Visit: Payer: Self-pay

## 2019-12-14 DIAGNOSIS — E785 Hyperlipidemia, unspecified: Secondary | ICD-10-CM | POA: Diagnosis present

## 2019-12-14 DIAGNOSIS — R69 Illness, unspecified: Secondary | ICD-10-CM | POA: Diagnosis present

## 2019-12-14 DIAGNOSIS — I10 Essential (primary) hypertension: Secondary | ICD-10-CM | POA: Diagnosis present

## 2019-12-14 LAB — LIPID PANEL
Cholesterol: 181 mg/dL (ref 0–200)
HDL: 89 mg/dL (ref >40)
LDL Cholesterol: 76 mg/dL (ref 0–99)
Total CHOL/HDL Ratio: 2 RATIO
Triglycerides: 82 mg/dL (ref <150)
VLDL: 16 mg/dL (ref 0–40)

## 2019-12-14 LAB — COMPREHENSIVE METABOLIC PANEL
ALT: 14 U/L (ref 0–44)
AST: 17 U/L (ref 15–41)
Albumin: 4.4 g/dL (ref 3.5–5.0)
Alkaline Phosphatase: 63 U/L (ref 38–126)
Anion gap: 9 (ref 5–15)
BUN: 22 mg/dL — ABNORMAL HIGH (ref 6–20)
CO2: 26 mmol/L (ref 22–32)
Calcium: 9.3 mg/dL (ref 8.9–10.3)
Chloride: 102 mmol/L (ref 98–111)
Creatinine, Ser: 1.14 mg/dL (ref 0.61–1.24)
GFR, Estimated: >60 mL/min (ref >60)
Glucose, Bld: 109 mg/dL — ABNORMAL HIGH (ref 70–99)
Potassium: 4.3 mmol/L (ref 3.5–5.1)
Sodium: 137 mmol/L (ref 135–145)
Total Bilirubin: 0.7 mg/dL (ref 0.3–1.2)
Total Protein: 7.2 g/dL (ref 6.5–8.1)

## 2019-12-16 ENCOUNTER — Ambulatory Visit: Payer: Medicaid Other | Admitting: Physician Assistant

## 2019-12-16 ENCOUNTER — Other Ambulatory Visit: Payer: Self-pay | Admitting: Physician Assistant

## 2019-12-16 MED ORDER — ATORVASTATIN CALCIUM 20 MG PO TABS: 20.0000 mg | ORAL_TABLET | Freq: Every day | ORAL | 0 refills | Status: AC

## 2019-12-16 MED ORDER — FLUTICASONE-SALMETEROL 100-50 MCG/DOSE IN AEPB: 1.0000 | INHALATION_SPRAY | Freq: Two times a day (BID) | RESPIRATORY_TRACT | 0 refills | Status: AC

## 2019-12-16 MED ORDER — ALBUTEROL SULFATE HFA 108 (90 BASE) MCG/ACT IN AERS: INHALATION_SPRAY | RESPIRATORY_TRACT | 0 refills | Status: AC

## 2019-12-16 MED ORDER — AMLODIPINE BESYLATE 10 MG PO TABS: 10.0000 mg | ORAL_TABLET | Freq: Every day | ORAL | 0 refills | Status: AC

## 2019-12-16 NOTE — Progress Notes (Signed)
Pt presented for his appointment but he now has medicaid so is no longer eligible to be a pt at the [facility] of [place].  As he is out of all his meds, a one month supply will be sent to local pharmacy.  He is encouraged to get established with new PCP right away so he won't have a lapse in care.  He states understanding

## 2019-12-20 ENCOUNTER — Other Ambulatory Visit: Payer: Self-pay | Admitting: Physician Assistant

## 2019-12-20 MED ORDER — MONTELUKAST SODIUM 10 MG PO TABS: ORAL_TABLET | ORAL | 0 refills | Status: AC

## 2019-12-20 MED ORDER — OMEPRAZOLE 20 MG PO CPDR: 20.0000 mg | DELAYED_RELEASE_CAPSULE | Freq: Every day | ORAL | 0 refills | Status: AC

## 2019-12-20 MED ORDER — MELOXICAM 15 MG PO TABS: 15.0000 mg | ORAL_TABLET | Freq: Every day | ORAL | 0 refills | Status: AC

## 2020-01-10 ENCOUNTER — Other Ambulatory Visit (HOSPITAL_COMMUNITY): Payer: Self-pay | Admitting: Internal Medicine

## 2020-01-10 ENCOUNTER — Other Ambulatory Visit: Payer: Self-pay

## 2020-01-10 ENCOUNTER — Other Ambulatory Visit (HOSPITAL_COMMUNITY)
Admission: RE | Admit: 2020-01-10 | Discharge: 2020-01-10 | Disposition: A | Discharge status: Home / Self Care | Payer: Medicaid Other | Source: Ambulatory Visit | Attending: Internal Medicine | Admitting: Internal Medicine

## 2020-01-10 ENCOUNTER — Ambulatory Visit (HOSPITAL_COMMUNITY)
Admission: RE | Admit: 2020-01-10 | Discharge: 2020-01-10 | Disposition: A | Discharge status: Home / Self Care | Payer: Medicaid Other | Source: Ambulatory Visit | Attending: Internal Medicine | Admitting: Internal Medicine

## 2020-01-10 DIAGNOSIS — J449 Chronic obstructive pulmonary disease, unspecified: Secondary | ICD-10-CM

## 2020-01-10 DIAGNOSIS — C8331 Diffuse large B-cell lymphoma, lymph nodes of head, face, and neck: Secondary | ICD-10-CM | POA: Insufficient documentation

## 2020-01-10 DIAGNOSIS — E11610 Type 2 diabetes mellitus with diabetic neuropathic arthropathy: Secondary | ICD-10-CM | POA: Insufficient documentation

## 2020-01-10 LAB — CBC WITH DIFFERENTIAL/PLATELET
Abs Immature Granulocytes: 0.01 10*3/uL (ref 0.00–0.07)
Basophils Absolute: 0 10*3/uL (ref 0.0–0.1)
Basophils Relative: 1 %
Eosinophils Absolute: 0.1 10*3/uL (ref 0.0–0.5)
Eosinophils Relative: 1 %
HCT: 43.6 % (ref 39.0–52.0)
Hemoglobin: 14.2 g/dL (ref 13.0–17.0)
Immature Granulocytes: 0 %
Lymphocytes Relative: 26 %
Lymphs Abs: 1.1 10*3/uL (ref 0.7–4.0)
MCH: 31.2 pg (ref 26.0–34.0)
MCHC: 32.6 g/dL (ref 30.0–36.0)
MCV: 95.8 fL (ref 80.0–100.0)
Monocytes Absolute: 0.6 10*3/uL (ref 0.1–1.0)
Monocytes Relative: 15 %
Neutro Abs: 2.4 10*3/uL (ref 1.7–7.7)
Neutrophils Relative %: 57 %
Platelets: 362 10*3/uL (ref 150–400)
RBC: 4.55 MIL/uL (ref 4.22–5.81)
RDW: 14.4 % (ref 11.5–15.5)
WBC: 4.2 10*3/uL (ref 4.0–10.5)
nRBC: 0 % (ref 0.0–0.2)

## 2020-01-10 LAB — BASIC METABOLIC PANEL
Anion gap: 9 (ref 5–15)
BUN: 15 mg/dL (ref 6–20)
CO2: 27 mmol/L (ref 22–32)
Calcium: 9.2 mg/dL (ref 8.9–10.3)
Chloride: 101 mmol/L (ref 98–111)
Creatinine, Ser: 0.99 mg/dL (ref 0.61–1.24)
GFR, Estimated: >60 mL/min (ref >60)
Glucose, Bld: 105 mg/dL — ABNORMAL HIGH (ref 70–99)
Potassium: 4 mmol/L (ref 3.5–5.1)
Sodium: 137 mmol/L (ref 135–145)

## 2020-01-10 LAB — LIPID PANEL
Cholesterol: 174 mg/dL (ref 0–200)
HDL: 100 mg/dL (ref >40)
LDL Cholesterol: 63 mg/dL (ref 0–99)
Total CHOL/HDL Ratio: 1.7 RATIO
Triglycerides: 56 mg/dL (ref <150)
VLDL: 11 mg/dL (ref 0–40)

## 2020-01-10 LAB — HEPATIC FUNCTION PANEL
ALT: 14 U/L (ref 0–44)
AST: 18 U/L (ref 15–41)
Albumin: 4.6 g/dL (ref 3.5–5.0)
Alkaline Phosphatase: 57 U/L (ref 38–126)
Bilirubin, Direct: 0.2 mg/dL (ref 0.0–0.2)
Indirect Bilirubin: 0.8 mg/dL (ref 0.3–0.9)
Total Bilirubin: 1 mg/dL (ref 0.3–1.2)
Total Protein: 7.4 g/dL (ref 6.5–8.1)

## 2020-01-10 LAB — HEMOGLOBIN A1C
Hgb A1c MFr Bld: 5 % (ref 4.8–5.6)
Mean Plasma Glucose: 96.8 mg/dL

## 2020-01-11 LAB — MICROALBUMIN / CREATININE URINE RATIO
Creatinine, Urine: 295.4 mg/dL
Microalb Creat Ratio: 5 mg/g creat (ref 0–29)
Microalb, Ur: 14.8 ug/mL — ABNORMAL HIGH

## 2020-02-11 ENCOUNTER — Encounter (HOSPITAL_COMMUNITY): Payer: Self-pay | Admitting: Emergency Medicine

## 2020-02-11 ENCOUNTER — Emergency Department (HOSPITAL_COMMUNITY)
Admission: EM | Admit: 2020-02-11 | Discharge: 2020-02-11 | Disposition: A | Discharge status: Home / Self Care | Payer: Medicaid Other | Attending: Emergency Medicine | Admitting: Emergency Medicine

## 2020-02-11 ENCOUNTER — Other Ambulatory Visit: Payer: Self-pay

## 2020-02-11 DIAGNOSIS — Z79899 Other long term (current) drug therapy: Secondary | ICD-10-CM | POA: Diagnosis not present

## 2020-02-11 DIAGNOSIS — R059 Cough, unspecified: Secondary | ICD-10-CM | POA: Diagnosis present

## 2020-02-11 DIAGNOSIS — J449 Chronic obstructive pulmonary disease, unspecified: Secondary | ICD-10-CM | POA: Insufficient documentation

## 2020-02-11 DIAGNOSIS — Z7951 Long term (current) use of inhaled steroids: Secondary | ICD-10-CM | POA: Diagnosis not present

## 2020-02-11 DIAGNOSIS — J45909 Unspecified asthma, uncomplicated: Secondary | ICD-10-CM | POA: Diagnosis not present

## 2020-02-11 DIAGNOSIS — Z20822 Contact with and (suspected) exposure to covid-19: Secondary | ICD-10-CM

## 2020-02-11 DIAGNOSIS — I10 Essential (primary) hypertension: Secondary | ICD-10-CM | POA: Insufficient documentation

## 2020-02-11 DIAGNOSIS — R067 Sneezing: Secondary | ICD-10-CM | POA: Diagnosis not present

## 2020-02-11 DIAGNOSIS — F17210 Nicotine dependence, cigarettes, uncomplicated: Secondary | ICD-10-CM | POA: Insufficient documentation

## 2020-02-11 LAB — RESP PANEL BY RT-PCR (FLU A&B, COVID) ARPGX2
Influenza A by PCR: NEGATIVE
Influenza B by PCR: NEGATIVE
SARS Coronavirus 2 by RT PCR: NEGATIVE

## 2020-02-11 MED ORDER — BENZONATATE 100 MG PO CAPS: 100.0000 mg | ORAL_CAPSULE | Freq: Three times a day (TID) | ORAL | 0 refills | Status: AC

## 2020-02-11 MED ORDER — ACETAMINOPHEN 500 MG PO TABS: 500.0000 mg | ORAL_TABLET | Freq: Four times a day (QID) | ORAL | 0 refills | Status: AC | PRN

## 2020-02-11 NOTE — ED Triage Notes (Signed)
Cough, sneezing and runny nose x 3 days

## 2020-02-11 NOTE — ED Provider Notes (Signed)
[facility] EMERGENCY DEPARTMENT Provider Note   CSN: [id number] Arrival date & time: 02/11/20  1645     History No chief complaint on file.   Daniel [name] is a 59 y.o. male.  The history is provided by the patient. No language interpreter was used.     59 year old male significant history of asthma, COPD, who has been vaccinated for COVID-19 presenting with cold symptoms. For the past 2 to 3 days he has been experiencing nonproductive cough, sneezing, runny nose, night sweats, and overall not feeling well. He does not complain of any fever, shortness of breath, loss of taste or smell, abdominal pain or dysuria. He did take over-the-counter medication with some relief. He denies any recent sick contact. He would like to be tested for COVID-19  Past Medical History:  Diagnosis Date  . Anxiety   . Arthritis   . Asthma   . COPD (chronic obstructive pulmonary disease) ([place])   . GERD (gastroesophageal reflux disease)   . Hypertension   . Tendonitis     Patient Active Problem List   Diagnosis Date Noted  . Reducible right inguinal hernia   . Sepsis ([place]) 03/29/2017  . Scrotal abscess 03/29/2017  . COPD (chronic obstructive pulmonary disease) ([place]) 03/29/2017  . HTN (hypertension) 03/29/2017  . Alcohol abuse 03/29/2017  . Tobacco abuse 03/29/2017  . Fracture, thyroid cartilage closed ([place]) 11/16/2014    Past Surgical History:  Procedure Laterality Date  . HAND SURGERY Right   . INGUINAL HERNIA REPAIR Right 01/04/2019   Procedure: RIGHT INGUINAL HERNIA REPAIR WITH MESH;  Surgeon: Virl Cagey, MD;  Location: [facility];  Service: General;  Laterality: Right;  . OTHER SURGICAL HISTORY  2019   surgery on the groin area due to infection       Family History  Problem Relation Age of Onset  . Heart disease Mother   . Hypertension Mother   . Diabetes Mother   . Cancer Sister        breast cancer  . Diabetes Sister   . Hypertension Sister   . Diabetes Sister   .  Hypertension Sister   . Anesthesia problems Neg Hx     Social History   Tobacco Use  . Smoking status: Current Every Day Smoker    Packs/day: 0.50    Years: 45.00    Pack years: 22.50    Types: Cigarettes  . Smokeless tobacco: Never Used  Vaping Use  . Vaping Use: Never used  Substance Use Topics  . Alcohol use: Yes    Comment: beer daily  . Drug use: No    Home Medications Prior to Admission medications   Medication Sig Start Date End Date Taking? Authorizing Provider  albuterol (PROVENTIL HFA) 108 (90 Base) MCG/ACT inhaler INHALE 1 TO 2 PUFFS BY MOUTH EVERY 6 HOURS AS NEEDED FOR COUGHING, WHEEZING, OR SHORTNESS OF BREATH 12/16/19   Soyla Dryer, PA-C  amLODipine (NORVASC) 10 MG tablet Take 1 tablet (10 mg total) by mouth daily. 12/16/19   Soyla Dryer, PA-C  atorvastatin (LIPITOR) 20 MG tablet Take 1 tablet (20 mg total) by mouth daily. 12/16/19   Soyla Dryer, PA-C  Fluticasone-Salmeterol (ADVAIR DISKUS) 100-50 MCG/DOSE AEPB Inhale 1 puff into the lungs 2 (two) times daily. 12/16/19   Soyla Dryer, PA-C  loratadine (CLARITIN) 10 MG tablet TAKE 1 Tablet BY MOUTH ONCE DAILY 10/05/19   Soyla Dryer, PA-C  meloxicam (MOBIC) 15 MG tablet Take 1 tablet (15 mg total) by mouth  daily. 12/20/19   Soyla Dryer, PA-C  montelukast (SINGULAIR) 10 MG tablet TAKE 1 Tablet BY MOUTH ONCE EVERY NIGHT AT BEDTIME 12/20/19   Soyla Dryer, PA-C  omeprazole (PRILOSEC) 20 MG capsule Take 1 capsule (20 mg total) by mouth daily. 12/20/19   Soyla Dryer, PA-C  sildenafil (VIAGRA) 100 MG tablet Take 0.5-1 tablets (50-100 mg total) by mouth daily as needed for erectile dysfunction. (no not take more than 1 tablet in 24 hour) 08/23/19   Soyla Dryer, PA-C    Allergies    Patient has no known allergies.  Review of Systems   Review of Systems  All other systems reviewed and are negative.   Physical Exam Updated Vital Signs BP 133/87 (BP Location: Right Arm)   Pulse 85    Temp 98.3 F (36.8 C) (Oral)   Resp 18   Ht 5\' 7"  (1.702 m)   Wt 72.6 kg   SpO2 98%   BMI 25.06 kg/m   Physical Exam Vitals and nursing note reviewed.  Constitutional:      General: He is not in acute distress.    Appearance: He is well-developed and well-nourished.  HENT:     Head: Atraumatic.     Nose: Nose normal.     Mouth/Throat:     Mouth: Mucous membranes are moist.  Eyes:     Conjunctiva/sclera: Conjunctivae normal.  Cardiovascular:     Rate and Rhythm: Normal rate and regular rhythm.     Pulses: Normal pulses.     Heart sounds: Normal heart sounds.  Pulmonary:     Effort: Pulmonary effort is normal.     Breath sounds: Wheezing (Faint expiratory wheezes no rales or rhonchi) present.  Abdominal:     Palpations: Abdomen is soft.     Tenderness: There is no abdominal tenderness.  Musculoskeletal:     Cervical back: Neck supple.  Skin:    Findings: No rash.  Neurological:     Mental Status: He is alert.  Psychiatric:        Mood and Affect: Mood and affect and mood normal.     ED Results / Procedures / Treatments   Labs (all labs ordered are listed, but only abnormal results are displayed) Labs Reviewed  RESP PANEL BY RT-PCR (FLU A&B, COVID) [id number]    EKG None  Radiology No results found.  Procedures Procedures (including critical care time)  Medications Ordered in ED Medications - No data to display  ED Course  I have reviewed the triage vital signs and the nursing notes.  Pertinent labs & imaging results that were available during my care of the patient were reviewed by me and considered in my medical decision making (see chart for details).    MDM Rules/Calculators/A&P                          BP 133/87 (BP Location: Right Arm)   Pulse 85   Temp 98.3 F (36.8 C) (Oral)   Resp 18   Ht 5\' 7"  (1.702 m)   Wt 72.6 kg   SpO2 98%   BMI 25.06 kg/m   Final Clinical Impression(s) / ED Diagnoses Final diagnoses:  Suspected COVID-19 virus  infection    Rx / DC Orders ED Discharge Orders         Ordered    acetaminophen (TYLENOL) 500 MG tablet  Every 6 hours PRN        02/11/20 1732  benzonatate (TESSALON) 100 MG capsule  Every 8 hours        02/11/20 1732         5:31 PM Patient here with cold symptoms. No hypoxia. Has been vaccinated. Screening Covid test ordered. Patient stable for discharge.  Daniel [name] was evaluated in Emergency Department on 02/11/2020 for the symptoms described in the history of present illness. He was evaluated in the context of the global COVID-19 pandemic, which necessitated consideration that the patient might be at risk for infection with the SARS-CoV-2 virus that causes COVID-19. Institutional protocols and algorithms that pertain to the evaluation of patients at risk for COVID-19 are in a state of rapid change based on information released by regulatory bodies including the CDC and federal and state organizations. These policies and algorithms were followed during the patient's care in the ED.    Fayrene Helper, PA-C 02/11/20 1733    Derwood Kaplan, MD 02/11/20 [phone]

## 2020-02-11 NOTE — Discharge Instructions (Signed)
You have been tested for COVID-19 infection. Please check result through MyChart, link below. If test positive, follow instruction below.  Recommendations for at home COVID-19 symptoms management:  Please continue isolation at home. Call [phone] to see whether you might be eligible for therapeutic antibody infusions (leave your name and they will call you back).  If have acute worsening of symptoms please go to ER/urgent care for further evaluation. Check pulse oximetry and if below 90-92% please go to ER. The following supplements MAY help:  Vitamin C 500mg  twice a day and Quercetin 250-500 mg twice a day Vitamin D3 2000 - 4000 u/day B Complex vitamins Zinc 75-100 mg/day Melatonin 6-10 mg at night (the optimal dose is unknown) Aspirin 81mg /day (if no history of bleeding issues)

## 2020-05-14 ENCOUNTER — Emergency Department (HOSPITAL_COMMUNITY): Payer: Medicaid Other

## 2020-05-14 ENCOUNTER — Emergency Department (HOSPITAL_COMMUNITY)
Admission: EM | Admit: 2020-05-14 | Discharge: 2020-05-14 | Disposition: A | Discharge status: Home / Self Care | Payer: Medicaid Other | Attending: Emergency Medicine | Admitting: Emergency Medicine

## 2020-05-14 ENCOUNTER — Other Ambulatory Visit: Payer: Self-pay

## 2020-05-14 ENCOUNTER — Encounter (HOSPITAL_COMMUNITY): Payer: Self-pay

## 2020-05-14 DIAGNOSIS — Z79899 Other long term (current) drug therapy: Secondary | ICD-10-CM | POA: Diagnosis not present

## 2020-05-14 DIAGNOSIS — J441 Chronic obstructive pulmonary disease with (acute) exacerbation: Secondary | ICD-10-CM

## 2020-05-14 DIAGNOSIS — R0602 Shortness of breath: Secondary | ICD-10-CM | POA: Diagnosis present

## 2020-05-14 DIAGNOSIS — F10129 Alcohol abuse with intoxication, unspecified: Secondary | ICD-10-CM | POA: Insufficient documentation

## 2020-05-14 DIAGNOSIS — Z7952 Long term (current) use of systemic steroids: Secondary | ICD-10-CM | POA: Diagnosis not present

## 2020-05-14 DIAGNOSIS — Y908 Blood alcohol level of 240 mg/100 ml or more: Secondary | ICD-10-CM | POA: Diagnosis not present

## 2020-05-14 DIAGNOSIS — R000 Tachycardia, unspecified: Secondary | ICD-10-CM | POA: Insufficient documentation

## 2020-05-14 DIAGNOSIS — F10920 Alcohol use, unspecified with intoxication, uncomplicated: Secondary | ICD-10-CM

## 2020-05-14 DIAGNOSIS — I10 Essential (primary) hypertension: Secondary | ICD-10-CM | POA: Diagnosis not present

## 2020-05-14 DIAGNOSIS — F17210 Nicotine dependence, cigarettes, uncomplicated: Secondary | ICD-10-CM | POA: Diagnosis not present

## 2020-05-14 DIAGNOSIS — J45909 Unspecified asthma, uncomplicated: Secondary | ICD-10-CM | POA: Diagnosis not present

## 2020-05-14 DIAGNOSIS — Z20822 Contact with and (suspected) exposure to covid-19: Secondary | ICD-10-CM | POA: Diagnosis not present

## 2020-05-14 LAB — CBC WITH DIFFERENTIAL/PLATELET
Abs Immature Granulocytes: 0.03 10*3/uL (ref 0.00–0.07)
Basophils Absolute: 0 10*3/uL (ref 0.0–0.1)
Basophils Relative: 0 %
Eosinophils Absolute: 0 10*3/uL (ref 0.0–0.5)
Eosinophils Relative: 0 %
HCT: 42.5 % (ref 39.0–52.0)
Hemoglobin: 14 g/dL (ref 13.0–17.0)
Immature Granulocytes: 0 %
Lymphocytes Relative: 17 %
Lymphs Abs: 1.5 10*3/uL (ref 0.7–4.0)
MCH: 31.7 pg (ref 26.0–34.0)
MCHC: 32.9 g/dL (ref 30.0–36.0)
MCV: 96.2 fL (ref 80.0–100.0)
Monocytes Absolute: 0.5 10*3/uL (ref 0.1–1.0)
Monocytes Relative: 6 %
Neutro Abs: 6.4 10*3/uL (ref 1.7–7.7)
Neutrophils Relative %: 77 %
Platelets: 386 10*3/uL (ref 150–400)
RBC: 4.42 MIL/uL (ref 4.22–5.81)
RDW: 15.5 % (ref 11.5–15.5)
WBC: 8.4 10*3/uL (ref 4.0–10.5)
nRBC: 0 % (ref 0.0–0.2)

## 2020-05-14 LAB — COMPREHENSIVE METABOLIC PANEL
ALT: 15 U/L (ref 0–44)
AST: 23 U/L (ref 15–41)
Albumin: 4.2 g/dL (ref 3.5–5.0)
Alkaline Phosphatase: 61 U/L (ref 38–126)
Anion gap: 14 (ref 5–15)
BUN: 22 mg/dL — ABNORMAL HIGH (ref 6–20)
CO2: 22 mmol/L (ref 22–32)
Calcium: 8.8 mg/dL — ABNORMAL LOW (ref 8.9–10.3)
Chloride: 108 mmol/L (ref 98–111)
Creatinine, Ser: 1.08 mg/dL (ref 0.61–1.24)
GFR, Estimated: >60 mL/min (ref >60)
Glucose, Bld: 76 mg/dL (ref 70–99)
Potassium: 3.3 mmol/L — ABNORMAL LOW (ref 3.5–5.1)
Sodium: 144 mmol/L (ref 135–145)
Total Bilirubin: 0.6 mg/dL (ref 0.3–1.2)
Total Protein: 7.4 g/dL (ref 6.5–8.1)

## 2020-05-14 LAB — RESP PANEL BY RT-PCR (FLU A&B, COVID) ARPGX2
Influenza A by PCR: NEGATIVE
Influenza B by PCR: NEGATIVE
SARS Coronavirus 2 by RT PCR: NEGATIVE

## 2020-05-14 LAB — MAGNESIUM: Magnesium: 2 mg/dL (ref 1.7–2.4)

## 2020-05-14 LAB — ETHANOL: Alcohol, Ethyl (B): 327 mg/dL (ref <10)

## 2020-05-14 LAB — TROPONIN I (HIGH SENSITIVITY)
Troponin I (High Sensitivity): 4 ng/L (ref <18)
Troponin I (High Sensitivity): 3 ng/L (ref <18)

## 2020-05-14 MED ORDER — PREDNISONE 20 MG PO TABS: 40.0000 mg | ORAL_TABLET | Freq: Every day | ORAL | 0 refills | Status: AC

## 2020-05-14 MED ORDER — ALBUTEROL (5 MG/ML) CONTINUOUS INHALATION SOLN
10.0000 mg/h | INHALATION_SOLUTION | RESPIRATORY_TRACT | Status: AC
  Administered 2020-05-14: 10 mg/h via RESPIRATORY_TRACT
  Filled 2020-05-14: qty 20

## 2020-05-14 MED ORDER — AEROCHAMBER PLUS FLO-VU MEDIUM MISC
1.0000 | Freq: Once | Status: AC
  Administered 2020-05-14: 1
  Filled 2020-05-14: qty 1

## 2020-05-14 MED ORDER — PREDNISONE 20 MG PO TABS
40.0000 mg | ORAL_TABLET | Freq: Once | ORAL | Status: AC
  Administered 2020-05-14: 40 mg via ORAL
  Filled 2020-05-14: qty 2

## 2020-05-14 MED ORDER — ALBUTEROL SULFATE HFA 108 (90 BASE) MCG/ACT IN AERS
6.0000 | INHALATION_SPRAY | Freq: Once | RESPIRATORY_TRACT | Status: AC
  Administered 2020-05-14: 6 via RESPIRATORY_TRACT
  Filled 2020-05-14: qty 6.7

## 2020-05-14 NOTE — ED Triage Notes (Signed)
Patient reports SOB for the past three days. States that his bilateral hands are swollen. Patient reports drinking ETOH since Friday when symptoms started. Patient has productive cough which he states is normal. Per EMS, patient's sats drop when ambulating into upper 80's.

## 2020-05-14 NOTE — ED Notes (Signed)
DC instructions discussed with pt including new prescription and how to take it. Explained s/s to watch for in case he needs to return. Explained he needs to make f/u appointment with his PCP. Pt dc'd with his home medications, inhaler. Family picking him up.

## 2020-05-14 NOTE — ED Notes (Signed)
Dr. Sabra Heck in room to complete MSE on patient arrival.

## 2020-05-14 NOTE — ED Notes (Signed)
Phlebotomy at bedside.

## 2020-05-14 NOTE — ED Triage Notes (Signed)
Patient states that he drank Liquor PTA.

## 2020-05-14 NOTE — ED Provider Notes (Signed)
[facility] EMERGENCY DEPARTMENT Provider Note   CSN: [id number] Arrival date & time: 05/14/20  0845     History Chief Complaint  Patient presents with  . Shortness of Breath    Daniel [name] is a 60 y.o. male.  HPI   This patient is a 60 year old male, he arrives to the hospital today with a complaint of shortness of breath, he does have a known history of both asthma and COPD, he reports that he does have a prescription for an inhaler which he does use occasionally, he took 2 puffs this morning but it did not really help and that is why he called the paramedics for transport.  He was noted to be wheezing prehospital, he has been drinking fairly heavily for the last several days as well.  He has been taking increased amounts of alcohol.  He reports to me that when he walks he becomes more short of breath, paramedics noted oxygen is in the upper 80% range when he walked.  He denies any swelling of his legs, he denies chest pain, he denies fevers or chills, he denies nausea or vomiting, he denies abdominal pain or back pain.  The patient reports not recently being seen for any respiratory illnesses.  That being said when I reviewed the medical record I can see that in December he was in the emergency department he was tested for Covid, and at that time it was negative for Covid and negative for flu.    Past Medical History:  Diagnosis Date  . Anxiety   . Arthritis   . Asthma   . COPD (chronic obstructive pulmonary disease) ([place])   . GERD (gastroesophageal reflux disease)   . Hypertension   . Tendonitis     Patient Active Problem List   Diagnosis Date Noted  . Reducible right inguinal hernia   . Sepsis ([place]) 03/29/2017  . Scrotal abscess 03/29/2017  . COPD (chronic obstructive pulmonary disease) ([place]) 03/29/2017  . HTN (hypertension) 03/29/2017  . Alcohol abuse 03/29/2017  . Tobacco abuse 03/29/2017  . Fracture, thyroid cartilage closed ([place]) 11/16/2014    Past Surgical  History:  Procedure Laterality Date  . HAND SURGERY Right   . INGUINAL HERNIA REPAIR Right 01/04/2019   Procedure: RIGHT INGUINAL HERNIA REPAIR WITH MESH;  Surgeon: Virl Cagey, MD;  Location: [facility];  Service: General;  Laterality: Right;  . OTHER SURGICAL HISTORY  2019   surgery on the groin area due to infection       Family History  Problem Relation Age of Onset  . Heart disease Mother   . Hypertension Mother   . Diabetes Mother   . Cancer Sister        breast cancer  . Diabetes Sister   . Hypertension Sister   . Diabetes Sister   . Hypertension Sister   . Anesthesia problems Neg Hx     Social History   Tobacco Use  . Smoking status: Current Every Day Smoker    Packs/day: 0.50    Years: 45.00    Pack years: 22.50    Types: Cigarettes  . Smokeless tobacco: Never Used  Vaping Use  . Vaping Use: Never used  Substance Use Topics  . Alcohol use: Yes    Comment: beer daily  . Drug use: No    Home Medications Prior to Admission medications   Medication Sig Start Date End Date Taking? Authorizing Provider  predniSONE (DELTASONE) 20 MG tablet Take 2 tablets (40 mg  total) by mouth daily. 05/14/20  Yes Noemi Chapel, MD  acetaminophen (TYLENOL) 500 MG tablet Take 1 tablet (500 mg total) by mouth every 6 (six) hours as needed. 02/11/20   Domenic Moras, PA-C  albuterol (PROVENTIL HFA) 108 (90 Base) MCG/ACT inhaler INHALE 1 TO 2 PUFFS BY MOUTH EVERY 6 HOURS AS NEEDED FOR COUGHING, WHEEZING, OR SHORTNESS OF BREATH 12/16/19   Soyla Dryer, PA-C  amLODipine (NORVASC) 10 MG tablet Take 1 tablet (10 mg total) by mouth daily. 12/16/19   Soyla Dryer, PA-C  atorvastatin (LIPITOR) 20 MG tablet Take 1 tablet (20 mg total) by mouth daily. 12/16/19   Soyla Dryer, PA-C  benzonatate (TESSALON) 100 MG capsule Take 1 capsule (100 mg total) by mouth every 8 (eight) hours. 02/11/20   Domenic Moras, PA-C  Fluticasone-Salmeterol (ADVAIR DISKUS) 100-50 MCG/DOSE AEPB Inhale 1 puff into  the lungs 2 (two) times daily. 12/16/19   Soyla Dryer, PA-C  loratadine (CLARITIN) 10 MG tablet TAKE 1 Tablet BY MOUTH ONCE DAILY 10/05/19   Soyla Dryer, PA-C  meloxicam (MOBIC) 15 MG tablet Take 1 tablet (15 mg total) by mouth daily. 12/20/19   Soyla Dryer, PA-C  montelukast (SINGULAIR) 10 MG tablet TAKE 1 Tablet BY MOUTH ONCE EVERY NIGHT AT BEDTIME 12/20/19   Soyla Dryer, PA-C  omeprazole (PRILOSEC) 20 MG capsule Take 1 capsule (20 mg total) by mouth daily. 12/20/19   Soyla Dryer, PA-C  sildenafil (VIAGRA) 100 MG tablet Take 0.5-1 tablets (50-100 mg total) by mouth daily as needed for erectile dysfunction. (no not take more than 1 tablet in 24 hour) 08/23/19   Soyla Dryer, PA-C    Allergies    Patient has no known allergies.  Review of Systems   Review of Systems  All other systems reviewed and are negative.   Physical Exam Updated Vital Signs BP 111/70   Pulse 98   Temp 97.9 F (36.6 C) (Oral)   Resp 20   Ht 1.702 m (5\' 7" )   Wt 72.9 kg   SpO2 100%   BMI 25.17 kg/m   Physical Exam Vitals and nursing note reviewed.  Constitutional:      General: He is not in acute distress.    Appearance: He is well-developed.  HENT:     Head: Normocephalic and atraumatic.     Mouth/Throat:     Pharynx: No oropharyngeal exudate.  Eyes:     General: No scleral icterus.       Right eye: No discharge.        Left eye: No discharge.     Conjunctiva/sclera: Conjunctivae normal.     Pupils: Pupils are equal, round, and reactive to light.  Neck:     Thyroid: No thyromegaly.     Vascular: No JVD.  Cardiovascular:     Rate and Rhythm: Regular rhythm. Tachycardia present.     Heart sounds: Normal heart sounds. No murmur heard. No friction rub. No gallop.   Pulmonary:     Effort: Pulmonary effort is normal. No respiratory distress.     Breath sounds: Wheezing present. No rales.     Comments: The patient is speaking in full sentences, he is not tachypneic, he is not  using accessory muscles.  He has diffuse expiratory wheezing in all lung fields with a mild prolonged expiratory phase Abdominal:     General: Bowel sounds are normal. There is no distension.     Palpations: Abdomen is soft. There is no mass.     Tenderness: There  is no abdominal tenderness.  Musculoskeletal:        General: No tenderness or signs of injury. Normal range of motion.     Cervical back: Normal range of motion and neck supple.     Right lower leg: No edema.     Left lower leg: No edema.  Lymphadenopathy:     Cervical: No cervical adenopathy.  Skin:    General: Skin is warm and dry.     Findings: No erythema or rash.  Neurological:     Mental Status: He is alert.     Coordination: Coordination normal.     Comments: The patient is able to move all 4 extremities, his speech is clear, he appears a little bit tired but able to answer all my questions appropriately, symmetrical face  Psychiatric:        Behavior: Behavior normal.     ED Results / Procedures / Treatments   Labs (all labs ordered are listed, but only abnormal results are displayed) Labs Reviewed  COMPREHENSIVE METABOLIC PANEL - Abnormal; Notable for the following components:      Result Value   Potassium 3.3 (*)    BUN 22 (*)    Calcium 8.8 (*)    All other components within normal limits  ETHANOL - Abnormal; Notable for the following components:   Alcohol, Ethyl (B) 327 (*)    All other components within normal limits  RESP PANEL BY RT-PCR (FLU A&B, COVID) ARPGX2  CBC WITH DIFFERENTIAL/PLATELET  MAGNESIUM  TROPONIN I (HIGH SENSITIVITY)  TROPONIN I (HIGH SENSITIVITY)    EKG EKG Interpretation  Date/Time:  Sunday May 14 2020 08:51:54 EDT Ventricular Rate:  121 PR Interval:  169 QRS Duration: 90 QT Interval:  326 QTC Calculation: 463 R Axis:   84 Text Interpretation: Sinus tachycardia LAE, consider biatrial enlargement Abnormal T, consider ischemia, diffuse leads now has abnormal T waves  inferior leads and lateral precordialleads compared with prior sinus tach now present Confirmed by Noemi Chapel [phone]) on 05/14/2020 8:57:09 AM   Radiology DG Chest Port 1 View  Result Date: 05/14/2020 CLINICAL DATA:  Cough, dyspnea EXAM: PORTABLE CHEST 1 VIEW COMPARISON:  01/10/2020 chest radiograph. FINDINGS: Stable cardiomediastinal silhouette with normal heart size. No pneumothorax. No pleural effusion. Lungs appear clear, with no acute consolidative airspace disease and no pulmonary edema. IMPRESSION: No active disease. Electronically Signed   By: Ilona Sorrel M.D.   On: 05/14/2020 10:05    Procedures Procedures   Medications Ordered in ED Medications  albuterol (PROVENTIL,VENTOLIN) solution continuous neb (10 mg/hr Nebulization New Bag/Given 05/14/20 1123)  albuterol (VENTOLIN HFA) 108 (90 Base) MCG/ACT inhaler 6 puff (6 puffs Inhalation Given 05/14/20 0939)  AeroChamber Plus Flo-Vu Medium MISC 1 each (1 each Other Given by Other 05/14/20 0944)  predniSONE (DELTASONE) tablet 40 mg (40 mg Oral Given 05/14/20 0910)    ED Course  I have reviewed the triage vital signs and the nursing notes.  Pertinent labs & imaging results that were available during my care of the patient were reviewed by me and considered in my medical decision making (see chart for details).    MDM Rules/Calculators/A&P                          This patient has multifactorial shortness of breath but I suspect it is primarily driven by reactive airway disease as he does report a productive cough, he reports a very runny nose, this is all  complicated by heavy alcohol use for the last several days, will need to make sure he is not aspirated, chest x-ray, labs, Covid test, the patient states he is fully vaccinated including a booster.  He is afebrile, he is tachycardic, he will need albuterol treatments as well  I have personally viewed the chest x-ray, there is no signs of infiltrate, the lab work is unremarkable, no  leukocytosis, no Covid, troponin pending.  The patient's alcohol level is significantly elevated consistent with heavy alcohol use.  He does not appear to be in withdrawal or severe intoxication, he is not apneic and is actually a fairly normal mental status.  The patient will continue to receive his albuterol treatments, anticipate discharge in the care of a sober ride, his oxygen levels are well above 90% on room air, he has improved with nebulized treatments and steroids.  Patient is now 100% on room air, looking better, heart rate is less than 100, blood work is reassuring including a normal troponin CBC and metabolic panel which was unremarkable, stable for discharge  Final Clinical Impression(s) / ED Diagnoses Final diagnoses:  COPD exacerbation ([place])  Alcoholic intoxication without complication ([place])    Rx / DC Orders ED Discharge Orders         Ordered    predniSONE (DELTASONE) 20 MG tablet  Daily        05/14/20 1216           Noemi Chapel, MD 05/14/20 1218

## 2020-05-14 NOTE — Discharge Instructions (Signed)
Please take prednisone daily for 5 days, continue taking albuterol 2 puffs every 4 hours as needed, take all of your daily medications as prescribed by your doctor and return to see them within 2 to 3 days, I would recommend you come back to the ER for worsening symptoms but you may be short of breath for several days

## 2020-08-09 ENCOUNTER — Other Ambulatory Visit (HOSPITAL_COMMUNITY): Payer: Self-pay | Admitting: Gerontology

## 2020-08-09 ENCOUNTER — Ambulatory Visit (HOSPITAL_COMMUNITY)
Admission: RE | Admit: 2020-08-09 | Discharge: 2020-08-09 | Disposition: A | Discharge status: Home / Self Care | Payer: Medicaid Other | Source: Ambulatory Visit | Attending: Gerontology | Admitting: Gerontology

## 2020-08-09 ENCOUNTER — Other Ambulatory Visit: Payer: Self-pay

## 2020-08-09 DIAGNOSIS — R0602 Shortness of breath: Secondary | ICD-10-CM | POA: Insufficient documentation

## 2020-08-11 ENCOUNTER — Other Ambulatory Visit (HOSPITAL_COMMUNITY): Payer: Self-pay | Admitting: Gerontology

## 2020-08-11 DIAGNOSIS — R9389 Abnormal findings on diagnostic imaging of other specified body structures: Secondary | ICD-10-CM

## 2020-08-24 ENCOUNTER — Ambulatory Visit (HOSPITAL_COMMUNITY)
Admission: RE | Admit: 2020-08-24 | Discharge: 2020-08-24 | Disposition: A | Discharge status: Home / Self Care | Payer: Medicaid Other | Source: Ambulatory Visit | Attending: Gerontology | Admitting: Gerontology

## 2020-08-24 ENCOUNTER — Other Ambulatory Visit: Payer: Self-pay

## 2020-08-24 DIAGNOSIS — R9389 Abnormal findings on diagnostic imaging of other specified body structures: Secondary | ICD-10-CM | POA: Diagnosis present

## 2020-09-27 NOTE — Addendum Note (Signed)
Encounter addended by: Annie Paras on: 09/27/2020 9:53 AM  Actions taken: Letter saved

## 2022-04-12 IMAGING — DX DG CHEST 2V
2 series · 2 of 2 positions shown · non-contrast
Comparison: 05/14/2020

CLINICAL DATA: 59-year-old male with a history of shortness of
breath

EXAM:
CHEST - 2 VIEW

[chest pa]
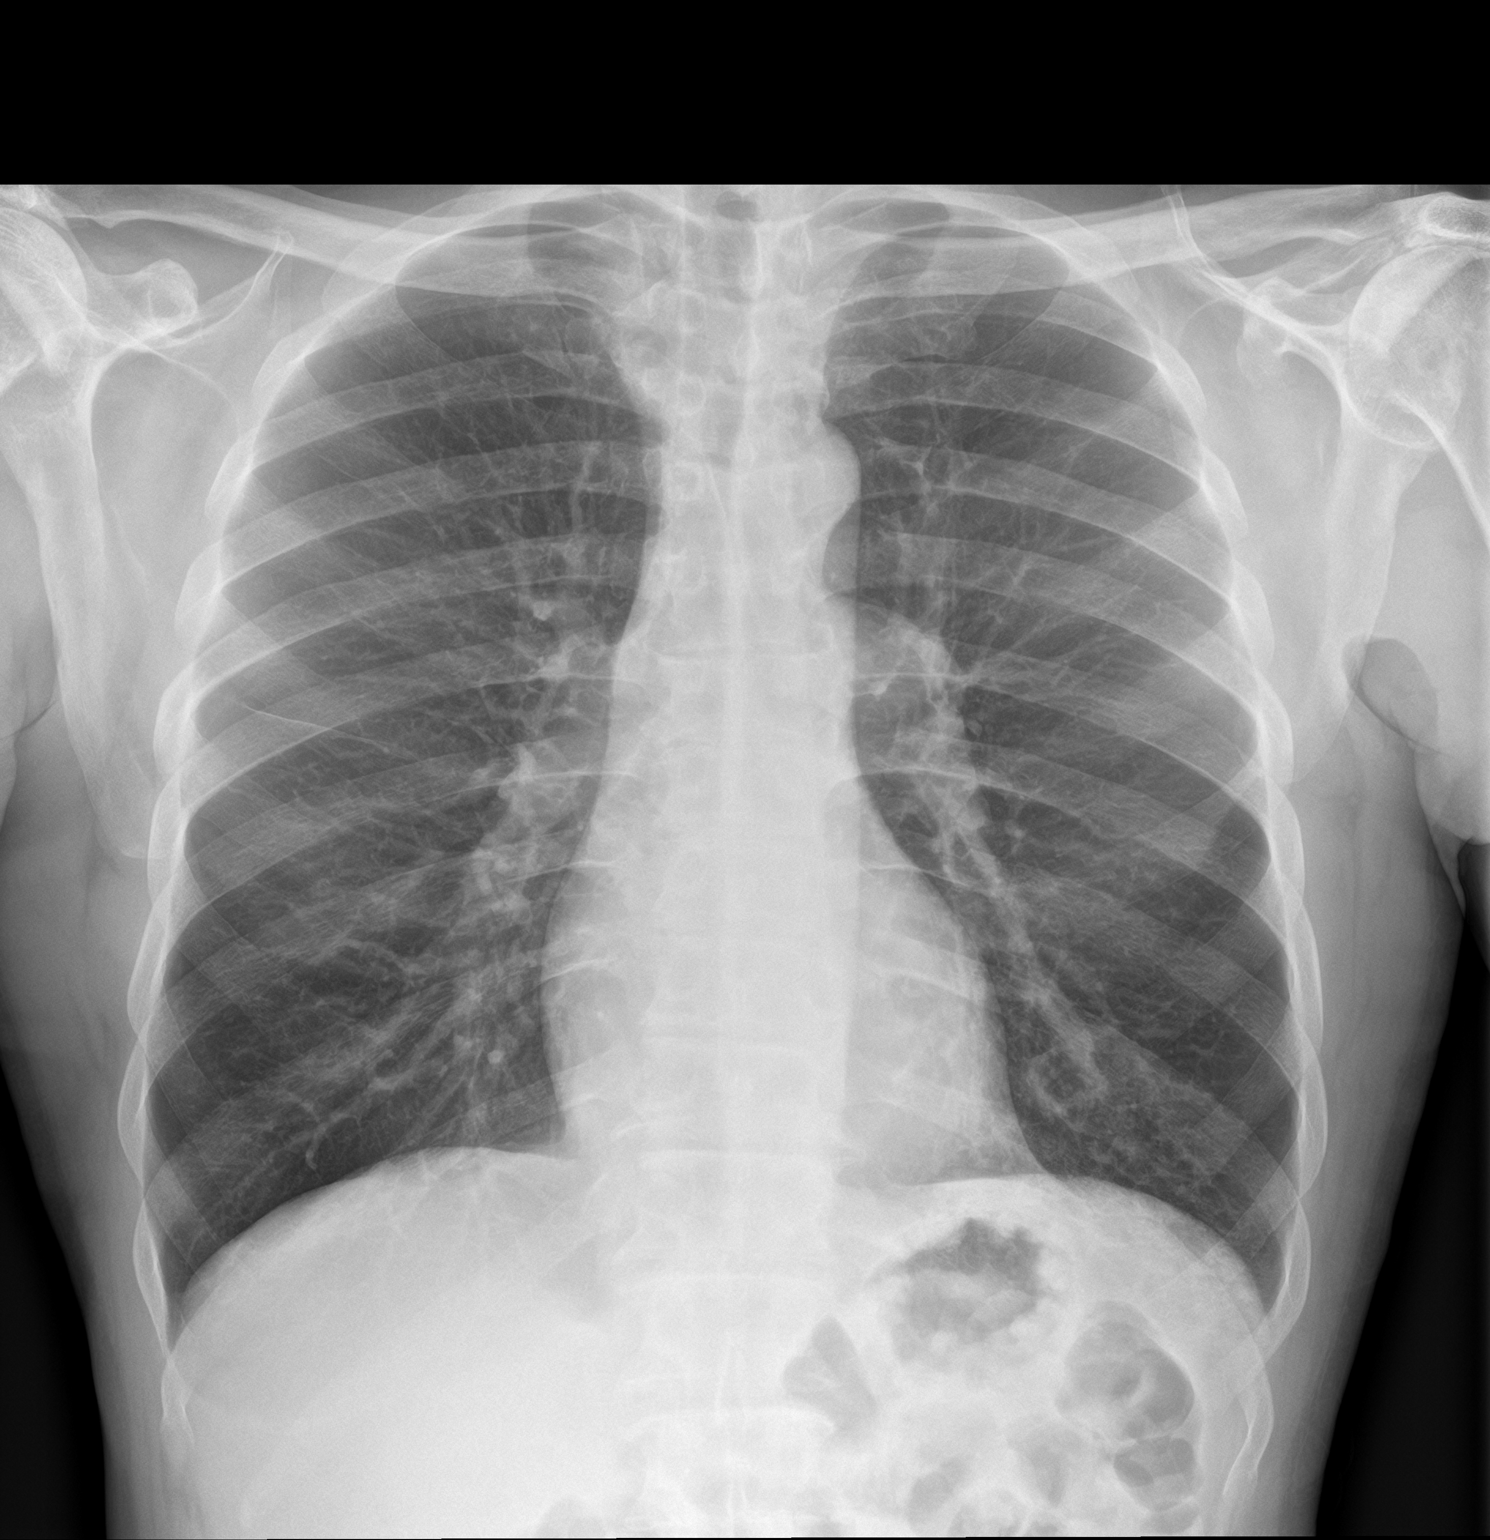

[chest lat]
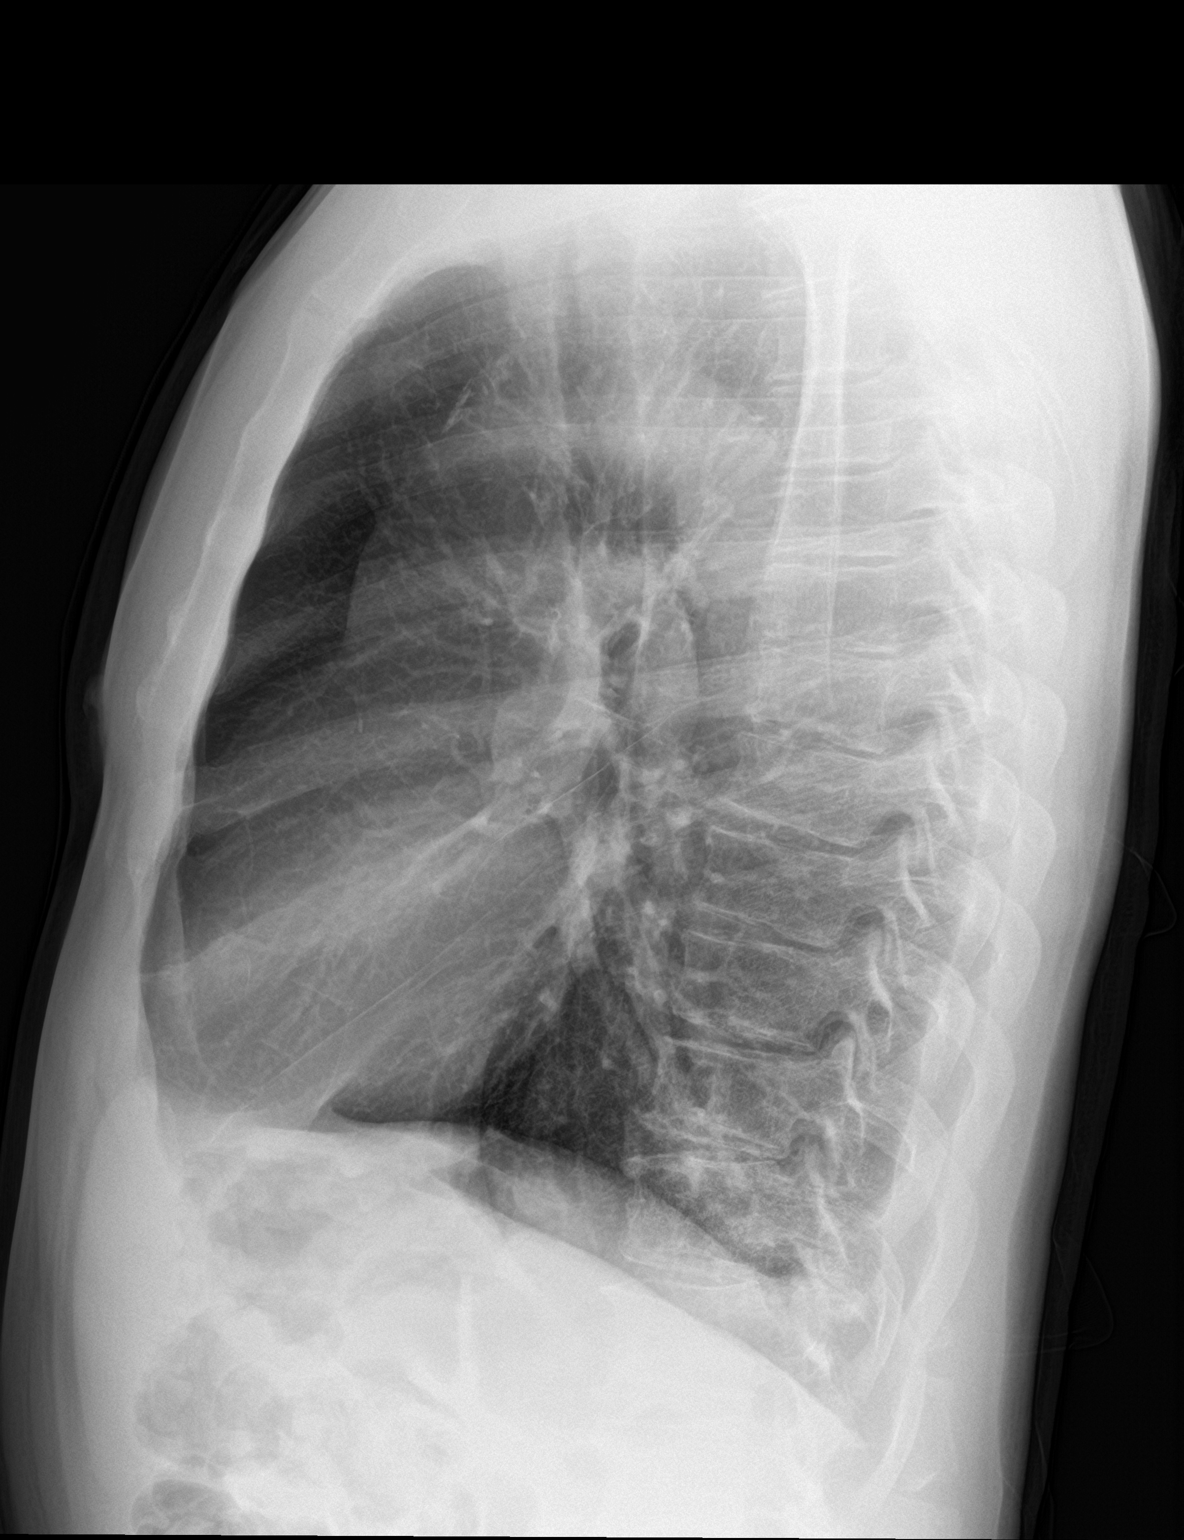

[2 of 2 positions shown; findings below may reference images not displayed]

FINDINGS: Cardiomediastinal silhouette unchanged in size and contour. No
evidence of central vascular congestion. No interlobular septal
thickening.

Stigmata of emphysema, with increased retrosternal airspace,
flattened hemidiaphragms, increased AP diameter, and hyperinflation
on the AP view.

Nodular density at the base of the left lung, new from the
comparison plain film, with questionable internal cavitation.

No pneumothorax or pleural effusion. Coarsened interstitial
markings, with no confluent airspace disease.

No acute displaced fracture. Degenerative changes of the spine.
IMPRESSION: New nodular density at the base of the left lung with questionable
internal cavitation. Differential include septic embolus or cavitary
pneumonia. Further evaluation with chest CT recommended.

Emphysema
